# Patient Record
Sex: Male | Born: 1955 | Race: Black or African American | Hispanic: No | Marital: Single | Smoking: Current every day smoker
Health system: Southern US, Community
[De-identification: ages and names within clinical notes are randomized; demographics above are authoritative.]

## PROBLEM LIST (undated history)

## (undated) DIAGNOSIS — R569 Unspecified convulsions: Secondary | ICD-10-CM

## (undated) DIAGNOSIS — F32A Depression, unspecified: Secondary | ICD-10-CM

## (undated) DIAGNOSIS — F329 Major depressive disorder, single episode, unspecified: Secondary | ICD-10-CM

---

## 1998-11-25 ENCOUNTER — Other Ambulatory Visit: Admission: RE | Admit: 1998-11-25 | Discharge: 1998-11-25 | Payer: Self-pay | Admitting: Orthopedic Surgery

## 2000-06-09 ENCOUNTER — Emergency Department (HOSPITAL_COMMUNITY): Admission: EM | Admit: 2000-06-09 | Discharge: 2000-06-09 | Payer: Self-pay

## 2000-06-09 ENCOUNTER — Encounter: Payer: Self-pay | Admitting: Emergency Medicine

## 2002-08-21 ENCOUNTER — Emergency Department (HOSPITAL_COMMUNITY): Admission: EM | Admit: 2002-08-21 | Discharge: 2002-08-21 | Payer: Self-pay | Admitting: Emergency Medicine

## 2006-02-06 ENCOUNTER — Emergency Department (HOSPITAL_COMMUNITY): Admission: EM | Admit: 2006-02-06 | Discharge: 2006-02-06 | Payer: Self-pay | Admitting: Emergency Medicine

## 2014-12-30 ENCOUNTER — Encounter (HOSPITAL_COMMUNITY): Payer: Self-pay | Admitting: Family Medicine

## 2014-12-30 ENCOUNTER — Emergency Department (HOSPITAL_COMMUNITY)
Admission: EM | Admit: 2014-12-30 | Discharge: 2014-12-31 | Disposition: A | Discharge status: Other Acute Inpatient Hospital | Payer: Non-veteran care | Attending: Emergency Medicine | Admitting: Emergency Medicine

## 2014-12-30 DIAGNOSIS — F32A Depression, unspecified: Secondary | ICD-10-CM

## 2014-12-30 DIAGNOSIS — F1010 Alcohol abuse, uncomplicated: Secondary | ICD-10-CM

## 2014-12-30 DIAGNOSIS — G40909 Epilepsy, unspecified, not intractable, without status epilepticus: Secondary | ICD-10-CM | POA: Insufficient documentation

## 2014-12-30 DIAGNOSIS — R45851 Suicidal ideations: Secondary | ICD-10-CM

## 2014-12-30 DIAGNOSIS — Z79899 Other long term (current) drug therapy: Secondary | ICD-10-CM | POA: Insufficient documentation

## 2014-12-30 DIAGNOSIS — Z720 Tobacco use: Secondary | ICD-10-CM | POA: Insufficient documentation

## 2014-12-30 DIAGNOSIS — F329 Major depressive disorder, single episode, unspecified: Secondary | ICD-10-CM | POA: Diagnosis not present

## 2014-12-30 DIAGNOSIS — F1110 Opioid abuse, uncomplicated: Secondary | ICD-10-CM | POA: Insufficient documentation

## 2014-12-30 DIAGNOSIS — Z87898 Personal history of other specified conditions: Secondary | ICD-10-CM

## 2014-12-30 HISTORY — DX: Depression, unspecified: F32.A

## 2014-12-30 HISTORY — DX: Unspecified convulsions: R56.9

## 2014-12-30 HISTORY — DX: Major depressive disorder, single episode, unspecified: F32.9

## 2014-12-30 LAB — COMPREHENSIVE METABOLIC PANEL
ALK PHOS: 63 U/L (ref 38–126)
ALT: 16 U/L — ABNORMAL LOW (ref 17–63)
AST: 17 U/L (ref 15–41)
Albumin: 2.9 g/dL — ABNORMAL LOW (ref 3.5–5.0)
Anion gap: 7 (ref 5–15)
BUN: 14 mg/dL (ref 6–20)
CALCIUM: 8.9 mg/dL (ref 8.9–10.3)
CO2: 27 mmol/L (ref 22–32)
CREATININE: 0.76 mg/dL (ref 0.61–1.24)
Chloride: 101 mmol/L (ref 101–111)
Glucose, Bld: 101 mg/dL — ABNORMAL HIGH (ref 65–99)
Potassium: 4 mmol/L (ref 3.5–5.1)
Sodium: 135 mmol/L (ref 135–145)
Total Bilirubin: 0.5 mg/dL (ref 0.3–1.2)
Total Protein: 6.2 g/dL — ABNORMAL LOW (ref 6.5–8.1)

## 2014-12-30 LAB — RAPID URINE DRUG SCREEN, HOSP PERFORMED
Amphetamines: NOT DETECTED
BENZODIAZEPINES: NOT DETECTED
Barbiturates: NOT DETECTED
Cocaine: NOT DETECTED
OPIATES: NOT DETECTED
TETRAHYDROCANNABINOL: NOT DETECTED

## 2014-12-30 LAB — CBC
HEMATOCRIT: 42.6 % (ref 39.0–52.0)
Hemoglobin: 14.5 g/dL (ref 13.0–17.0)
MCH: 28.6 pg (ref 26.0–34.0)
MCHC: 34 g/dL (ref 30.0–36.0)
MCV: 84 fL (ref 78.0–100.0)
Platelets: 348 10*3/uL (ref 150–400)
RBC: 5.07 MIL/uL (ref 4.22–5.81)
RDW: 14.3 % (ref 11.5–15.5)
WBC: 8.3 10*3/uL (ref 4.0–10.5)

## 2014-12-30 LAB — ACETAMINOPHEN LEVEL: Acetaminophen (Tylenol), Serum: <10 ug/mL — ABNORMAL LOW (ref 10–30)

## 2014-12-30 LAB — SALICYLATE LEVEL: Salicylate Lvl: <4 mg/dL (ref 2.8–30.0)

## 2014-12-30 LAB — ETHANOL

## 2014-12-30 MED ORDER — LORAZEPAM 1 MG PO TABS: 1.0000 mg | ORAL_TABLET | Freq: Three times a day (TID) | ORAL | Status: DC | PRN

## 2014-12-30 MED ORDER — ACETAMINOPHEN 325 MG PO TABS: 650.0000 mg | ORAL_TABLET | ORAL | Status: DC | PRN

## 2014-12-30 MED ORDER — ONDANSETRON HCL 4 MG PO TABS: 4.0000 mg | ORAL_TABLET | Freq: Three times a day (TID) | ORAL | Status: DC | PRN

## 2014-12-30 MED ORDER — ALUM & MAG HYDROXIDE-SIMETH 200-200-20 MG/5ML PO SUSP: 30.0000 mL | ORAL | Status: DC | PRN

## 2014-12-30 MED ORDER — NICOTINE 21 MG/24HR TD PT24
21.0000 mg | MEDICATED_PATCH | Freq: Every day | TRANSDERMAL | Status: DC
  Administered 2014-12-30: 21 mg via TRANSDERMAL
  Filled 2014-12-30: qty 1

## 2014-12-30 MED ORDER — IBUPROFEN 400 MG PO TABS: 600.0000 mg | ORAL_TABLET | Freq: Three times a day (TID) | ORAL | Status: DC | PRN

## 2014-12-30 MED ORDER — ZOLPIDEM TARTRATE 5 MG PO TABS: 5.0000 mg | ORAL_TABLET | Freq: Every evening | ORAL | Status: DC | PRN

## 2014-12-30 NOTE — B[facility] Counselor (Signed)
[facility] CoyerEric Kaplan, pt has been accepted to bed 406-1 pending review of his UDS. Pt's RN aware that UDS needs to be collected.  Evette Cristalaroline Paige Julene Rahn, [place] Therapeutic Triage Specialist

## 2014-12-30 NOTE — ED Provider Notes (Signed)
CSN: [id number]     Arrival date & time 12/30/14  1254 History   First MD Initiated Contact with Patient 12/30/14 1549     Chief Complaint  Patient presents with  . Suicidal     (Consider location/radiation/quality/duration/timing/severity/associated sxs/prior Treatment) HPI Pt is a 59yo male with hx of depression and seizures well controlled with medications, brought to ED by his wife with request for help with his depression and increase SI thoughts. Pt states he has had SI for several months but worse over the last few days. Pt is requesting medication increase or change to help with his severe depression. Pt denies drinking daily but pt's wife indicates pt is a heavy drinker.  Denies seizures from withdrawal from alcohol.  Pt states he has tried to cut himself, his wife took his knife away.  Denies hallucinations. Denies self harm.  Denies HI.  States he has had "several ideas" for SI but did not want to discuss in detail.  Denies any other concerns or complaints at this time. Denies CP, SOB, abdominal pain, n/v/d.   Past Medical History  Diagnosis Date  . Depression   . Seizures    History reviewed. No pertinent past surgical history. History reviewed. No pertinent family history. History  Substance Use Topics  . Smoking status: Current Every Day Smoker  . Smokeless tobacco: Not on file  . Alcohol Use: Yes    Review of Systems  Constitutional: Negative for fever, chills and fatigue.  Respiratory: Negative for shortness of breath.   Cardiovascular: Negative for chest pain and palpitations.  Gastrointestinal: Negative for nausea, vomiting and abdominal pain.  Psychiatric/Behavioral: Positive for suicidal ideas and sleep disturbance. Negative for hallucinations and self-injury. The patient is not nervous/anxious.   All other systems reviewed and are negative.     Allergies  Review of patient's allergies indicates no known allergies.  Home Medications   Prior to Admission  medications   Medication Sig Start Date End Date Taking? Authorizing Provider  lamoTRIgine (LAMICTAL) 100 MG tablet Take 100 mg by mouth 2 (two) times daily.   Yes Historical Provider, MD  levETIRAcetam (KEPPRA) 500 MG tablet Take 2,000 mg by mouth 2 (two) times daily.   Yes Historical Provider, MD  PARoxetine (PAXIL) 40 MG tablet Take 40 mg by mouth every morning.   Yes Historical Provider, MD   BP 172/88 mmHg  Pulse 63  Temp(Src) 98 F (36.7 C)  Resp 18  Ht 5\' 7"  (1.702 m)  Wt 157 lb (71.215 kg)  BMI 24.58 kg/m2  SpO2 98% Physical Exam  Constitutional: He appears well-developed and well-nourished.  HENT:  Head: Normocephalic and atraumatic.  Eyes: Conjunctivae are normal. No scleral icterus.  Neck: Normal range of motion.  Cardiovascular: Normal rate, regular rhythm and normal heart sounds.   Pulmonary/Chest: Effort normal and breath sounds normal. No respiratory distress. He has no wheezes. He has no rales. He exhibits no tenderness.  Abdominal: Soft. Bowel sounds are normal. He exhibits no distension and no mass. There is no tenderness. There is no rebound and no guarding.  Musculoskeletal: Normal range of motion.  Neurological: He is alert.  Skin: Skin is warm and dry.  Psychiatric: His speech is normal and behavior is normal. He exhibits a depressed mood. He expresses suicidal ideation. He expresses no homicidal ideation. He expresses suicidal plans ( "ive had a few"). He expresses no homicidal plans.  Nursing note and vitals reviewed.   ED Course  Procedures (including critical care time)  Labs Review Labs Reviewed  COMPREHENSIVE METABOLIC PANEL - Abnormal; Notable for the following:    Glucose, Bld 101 (*)    Total Protein 6.2 (*)    Albumin 2.9 (*)    ALT 16 (*)    All other components within normal limits  ACETAMINOPHEN LEVEL - Abnormal; Notable for the following:    Acetaminophen (Tylenol), Serum <10 (*)    All other components within normal limits  CBC  ETHANOL   SALICYLATE LEVEL  URINE RAPID DRUG SCREEN (HOSP PERFORMED) NOT AT [facility]    Imaging Review No results found.   EKG Interpretation None      MDM   Final diagnoses:  Suicidal ideations  Alcohol abuse  Depression  History of seizures    Pt is a 59yo male presenting to ED with request for help with increased SI.  Pt states he has thought of cutting himself and "several other ideas"  Denies self harm. Denies HI. Pt has hx of seizures, well controlled with medication, Keppra. Pt medically cleared.   Psych hold orders placed.  Per consult note from [facility], recommended pt be placed for inpatient treatment.     Junius Finner, PA-C 12/30/14 1832  Rolan Bucco, MD 12/30/14 [phone]

## 2014-12-30 NOTE — ED Notes (Signed)
Pt sts that he has been treated for depression over the past 30 years. sts they recently increased medication dose and not feeling better. sts SI thoughts.

## 2014-12-30 NOTE — BH Assessment (Signed)
B[facility] Assessment Progress Note   Patient's UDS is clear.  Tori, AC said pt was fine to come on over.  This clinician called to C pod and spoke to nurse Tiffany and let her know patient was accepted.  Accepting physician is Dr. Jama Flavorsobos.  Tiffany said she would [facility] know about the [facility].  Pt can come over when ready to be sent.

## 2014-12-30 NOTE — ED Notes (Signed)
[facility] contacted for transport 

## 2014-12-30 NOTE — BH Assessment (Addendum)
Tele Assessment Note   Jonathan HoltsDavid Pierce is an 59 y.o. male. Writer was unable to get in contact w/ Jonathan RanaErin OMalley PA-C prior to teleassessment as [facility] secretary couldn't find her. Pt is oriented x 4 and cooperative during assessment. His wife is at bedside. He reports a depressed and anxious mood. His affect is depressed. Pt sts that he has had more frequent SI in the past 8 or 9 mos. Pt sts for the past few mos he has experienced SI "2 to 3" times daily. He says, "I'm starting to act on them." Pt reports he has been walking around the house with a knife lately. He says that his wife takes the knife away from him. Pt sts that he thinks "how soon I could do this and be done with it" referring to suicide. Pt reports he thinks of cutting himself or driving off a bridge or into traffic as a suicide attempt. He says that is afraid that he will harm or kill himself. Pt says he was in the [employer] for 6 yrs and [facility] management and therapy. He denies hx of suicide attempts and denies hx of inpatient admissions. Pt reports he drinks approx one six pack of 12 oz beers once a week. Pt reports he first noticed depressive sxs in 1986. Pt denies there was any precipitating factor to onset of depression. Pt reports sister has been dx with schizophrenia and he asks Clinical research associatewriter, "Is this as bad as what she has?". Writer reassured pt that pt's sxs weren't indicative of schizophrenia. He denies [facility] and no delusions noted. Wife reports pt has become confused over the past year and that he has become irritable with mood swings.  Writer ran pt by Claudette Headonrad Withrow NP who recommends inpatient treatment. Writer notified pt's RN Jonathan Pierce of [facility]position and writer asked that pt's UDS be processed.  Axis I:  Major Depressive Disorder, Recurrent, Severe without Psychotic Features Axis II: Deferred Axis III:  Past Medical History  Diagnosis Date  . Depression   . Seizures    Axis IV: other psychosocial or  environmental problems, problems related to social environment and problems with primary support group Axis V: 31-40 impairment in reality testing  Past Medical History:  Past Medical History  Diagnosis Date  . Depression   . Seizures     History reviewed. No pertinent past surgical history.  Family History: History reviewed. No pertinent family history.  Social History:  reports that he has been smoking.  He does not have any smokeless tobacco history on file. He reports that he drinks alcohol. He reports that he does not use illicit drugs.  Additional Social History:  Alcohol / Drug Use Pain Medications: pt denies abuse - see PTA meds list Prescriptions: pt denies abuse - see PTA meds list Over the Counter: pt denies abuse - see PTA meds list History of alcohol / drug use?: Yes Substance #1 Name of Substance 1: alcohol 1 - Age of First Use: 23 1 - Amount (size/oz): 6 pack 12 oz beers 1 - Last Use / Amount: 12/28/14  CIWA: CIWA-Ar BP: 172/88 mmHg Pulse Rate: 63 COWS:    PATIENT STRENGTHS: (choose at least two) Ability for insight Average or above average intelligence Communication skills General fund of knowledge  Allergies: No Known Allergies  Home Medications:  (Not in a hospital admission)  OB/GYN Status:  No LMP for male patient.  General Assessment Data [facility]  TTS Assessment: In system Is this a Tele or Face-to-Face Assessment?: Tele Assessment Is this an Initial Assessment or a Re-assessment for this encounter?: Initial Assessment Marital status: Married [place] name: na Is patient pregnant?: No Living Arrangements: Spouse/significant other Can pt return to current living arrangement?: Yes Admission Status: Voluntary Is patient capable of signing voluntary admission?: Yes Referral Source: Self/Family/Friend Insurance type: SP (EPIC sts self pay but pt goes to [facility] [place])     Crisis Care Plan Living Arrangements:  Spouse/significant other Name of Psychiatrist: [name], [facility] [place] Name of Therapist: none  Education Status Is patient currently in school?: No Highest grade of school patient has completed: 12  Risk to self with the past 6 months Suicidal Ideation: Yes-Currently Present Has patient been a risk to self within the past 6 months prior to admission? : Yes Suicidal Intent: Yes-Currently Present Has patient had any suicidal intent within the past 6 months prior to admission? : Yes Is patient at risk for suicide?: Yes Suicidal Plan?: Yes-Currently Present Has patient had any suicidal plan within the past 6 months prior to admission? : Yes Specify Current Suicidal Plan: cut himself w knife, drive off bridge or into traffic Access to Means: Yes Specify Access to Suicidal Means: access to sharps, access to car What has been your use of drugs/alcohol within the last 12 months?: pt sts drinks alcohol once weekly Previous Attempts/Gestures: No How many times?: 0 Other Self Harm Risks: none Triggers for Past Attempts:  (n/a) Intentional Self Injurious Behavior: None Family Suicide History: No (pt's sister dx with schizophrenia) Recent stressful life event(s): Other (Comment), Recent negative physical changes (increasing depressive sxs) Persecutory voices/beliefs?: No Depression: Yes Depression Symptoms: Despondent, Insomnia, Isolating, Fatigue, Guilt, Loss of interest in usual pleasures, Feeling angry/irritable, Feeling worthless/self pity Substance abuse history and/or treatment for substance abuse?: No Suicide prevention information given to non-admitted patients: Not applicable  Risk to Others within the past 6 months Homicidal Ideation: No Does patient have any lifetime risk of violence toward others beyond the six months prior to admission? : No Thoughts of Harm to Others: No Current Homicidal Intent: No Current Homicidal Plan: No Access to Homicidal Means: No Identified  Victim: none History of harm to others?: No Assessment of Violence: None Noted Violent Behavior Description: pt denies hx violence - is calm and cooperative Does patient have access to weapons?: No Criminal Charges Pending?: No Does patient have a court date: No Is patient on probation?: No  Psychosis Hallucinations: None noted Delusions: None noted  Mental Status Report Appearance/Hygiene: In scrubs (unremarkable) Eye Contact: Good Motor Activity: Freedom of movement Speech: Logical/coherent Level of Consciousness: Alert Mood: Depressed, Anxious, Sad, Anhedonia Affect: Appropriate to circumstance, Depressed, Sad Anxiety Level: Moderate Thought Processes: Relevant, Coherent Judgement: Unimpaired Orientation: Person, Place, Time, Situation Obsessive Compulsive Thoughts/Behaviors: None  Cognitive Functioning Concentration: Normal Memory: Recent Impaired, Remote Intact IQ: Average Insight: Good Impulse Control: Fair Appetite: Poor Weight Loss:  (pt sts thinks he has lost weight but unknown amount) Sleep: Decreased Total Hours of Sleep: 4 Vegetative Symptoms: None  ADLScreening [facility] Assessment Services) Patient's cognitive ability adequate to safely complete daily activities?: Yes Patient able to express need for assistance with ADLs?: Yes Independently performs ADLs?: Yes (appropriate for developmental age)  Prior Inpatient Therapy Prior Inpatient Therapy: No Prior Therapy Dates: na Prior Therapy Facilty/Provider(s): na Reason for Treatment: na  Prior Outpatient Therapy Prior Outpatient Therapy: Yes Prior Therapy Dates: currently once a month Prior Therapy Facilty/Provider(s): [place] -  Jonathan Ranks NP Reason for Treatment: MDD, med management Does patient have an ACCT team?: No Does patient have Intensive In-House Services?  : No Does patient have [facility] services? : No Does patient have P4CC services?: No  ADL Screening (condition at time of  admission) Patient's cognitive ability adequate to safely complete daily activities?: Yes Is the patient deaf or have difficulty hearing?: No Does the patient have difficulty seeing, even when wearing glasses/contacts?: No Does the patient have difficulty concentrating, remembering, or making decisions?: Yes Patient able to express need for assistance with ADLs?: Yes Does the patient have difficulty dressing or bathing?: No Independently performs ADLs?: Yes (appropriate for developmental age) Does the patient have difficulty walking or climbing stairs?: No Weakness of Legs: None Weakness of Arms/Hands: None  Home Assistive Devices/Equipment Home Assistive Devices/Equipment: Eyeglasses    Abuse/Neglect Assessment (Assessment to be complete while patient is alone) Physical Abuse: Denies Verbal Abuse: Denies Sexual Abuse: Denies Exploitation of patient/patient's resources: Denies Self-Neglect: Denies     Merchant navy officer (For Healthcare) Does patient have an advance directive?: No Would patient like information on creating an advanced directive?: No - patient declined information    Additional Information 1:1 In Past 12 Months?: No CIRT Risk: No Elopement Risk: No Does patient have medical clearance?: Yes     Disposition:  Disposition Initial Assessment Completed for this Encounter: Yes Disposition of Patient: Inpatient treatment program Type of inpatient treatment program: Adult (conrad withrow NP recommends inpatient treatment)  Pressley Tadesse P 12/30/2014 4:49 PM

## 2014-12-31 ENCOUNTER — Inpatient Hospital Stay (HOSPITAL_COMMUNITY)
Admission: AD | Admit: 2014-12-31 | Discharge: 2015-01-02 | DRG: 885 | Disposition: A | Discharge status: Home / Self Care | Payer: Federal, State, Local not specified - Other | Source: Intra-hospital | Attending: Psychiatry | Admitting: Psychiatry

## 2014-12-31 ENCOUNTER — Encounter (HOSPITAL_COMMUNITY): Payer: Self-pay | Admitting: *Deleted

## 2014-12-31 DIAGNOSIS — Z818 Family history of other mental and behavioral disorders: Secondary | ICD-10-CM

## 2014-12-31 DIAGNOSIS — F332 Major depressive disorder, recurrent severe without psychotic features: Secondary | ICD-10-CM | POA: Diagnosis present

## 2014-12-31 DIAGNOSIS — G4700 Insomnia, unspecified: Secondary | ICD-10-CM | POA: Diagnosis present

## 2014-12-31 DIAGNOSIS — R45851 Suicidal ideations: Secondary | ICD-10-CM | POA: Diagnosis present

## 2014-12-31 DIAGNOSIS — F17210 Nicotine dependence, cigarettes, uncomplicated: Secondary | ICD-10-CM | POA: Diagnosis present

## 2014-12-31 MED ORDER — ALUM & MAG HYDROXIDE-SIMETH 200-200-20 MG/5ML PO SUSP: 30.0000 mL | ORAL | Status: DC | PRN

## 2014-12-31 MED ORDER — MAGNESIUM HYDROXIDE 400 MG/5ML PO SUSP
30.0000 mL | Freq: Every day | ORAL | Status: DC | PRN
Start: 2014-12-31 — End: 2015-01-02

## 2014-12-31 MED ORDER — HYDROXYZINE HCL 25 MG PO TABS
25.0000 mg | ORAL_TABLET | Freq: Four times a day (QID) | ORAL | Status: DC | PRN
  Administered 2014-12-31: 25 mg via ORAL
  Filled 2014-12-31: qty 20

## 2014-12-31 MED ORDER — CITALOPRAM HYDROBROMIDE 10 MG PO TABS
10.0000 mg | ORAL_TABLET | Freq: Every day | ORAL | Status: DC
  Administered 2015-01-01: 10 mg via ORAL
  Filled 2014-12-31 (×3): qty 1

## 2014-12-31 MED ORDER — LEVETIRACETAM 750 MG PO TABS
2000.0000 mg | ORAL_TABLET | Freq: Two times a day (BID) | ORAL | Status: DC
  Administered 2014-12-31 – 2015-01-02 (×6): 2000 mg via ORAL
  Filled 2014-12-31 (×2): qty 1
  Filled 2014-12-31: qty 6
  Filled 2014-12-31 (×7): qty 1
  Filled 2014-12-31: qty 4
  Filled 2014-12-31: qty 1

## 2014-12-31 MED ORDER — TRAZODONE HCL 50 MG PO TABS
50.0000 mg | ORAL_TABLET | Freq: Every evening | ORAL | Status: DC | PRN
  Administered 2014-12-31 – 2015-01-01 (×2): 50 mg via ORAL
  Filled 2014-12-31: qty 1
  Filled 2014-12-31: qty 28
  Filled 2014-12-31 (×2): qty 1
  Filled 2014-12-31: qty 28
  Filled 2014-12-31 (×5): qty 1

## 2014-12-31 MED ORDER — PAROXETINE HCL 20 MG PO TABS
40.0000 mg | ORAL_TABLET | ORAL | Status: DC
  Administered 2014-12-31: 40 mg via ORAL
  Filled 2014-12-31 (×4): qty 2

## 2014-12-31 MED ORDER — ACETAMINOPHEN 325 MG PO TABS: 650.0000 mg | ORAL_TABLET | Freq: Four times a day (QID) | ORAL | Status: DC | PRN

## 2014-12-31 NOTE — BHH Suicide Risk Assessment (Signed)
[facility] Admission Suicide Risk Assessment   Nursing information obtained from:    Demographic factors:   59 yo married man, employed, has adult children Current Mental Status:   see below Loss Factors:   worsening depression, financial difficulties Historical Factors:   long history of depression Risk Reduction Factors:   resilience Total Time spent with patient: 45 minutes Principal Problem: MDD (major depressive disorder), recurrent episode, severe Diagnosis:   Patient Active Problem List   Diagnosis Date Noted  . MDD (major depressive disorder), recurrent episode, severe [F33.2] 12/31/2014     Continued Clinical Symptoms:  Alcohol Use Disorder Identification Test Final Score (AUDIT): 6 The "Alcohol Use Disorders Identification Test", Guidelines for Use in Primary Care, Second Edition.  World Science writer [facility]). Score between 0-7:  no or low risk or alcohol related problems. Score between 8-15:  moderate risk of alcohol related problems. Score between 16-19:  high risk of alcohol related problems. Score 20 or above:  warrants further diagnostic evaluation for alcohol dependence and treatment.   CLINICAL FACTORS:   Patient is a 59 yo man. He is married, has 2 adult children. He states he has a long history of depression. He states that for many years he's been treated with Paxil which he has historically tolerated well and responded to. However, over recent weeks to months his depression had worsened. He attributes this partially to having lost job (now is employed again) and financial difficulties. He denies alcohol or drug abuse. Of note, has a history of epilepsy x many years. Well controlled on Keppra. Due to worsening depression, Paxil dose had been increased without any improvement. He has developed suicidal ideations and yesterday was thinking about cutting himself with a knife which led to admission. Diagnosis: Major Depression, Recurrent, without psychotic features.  Seizure disorder. Plan: Agrees to D/C Paxil. Start Celexa 10 mg qday initially. Continue Keppra.   Musculoskeletal: Strength & Muscle Tone: within normal limits Gait & Station: normal Patient leans: N/A  Psychiatric Specialty Exam: Physical Exam  Review of Systems  Constitutional: Negative.   HENT: Negative.   Eyes: Negative.   Respiratory: Negative.   Cardiovascular: Negative.   Gastrointestinal: Negative.   Genitourinary: Negative.   Musculoskeletal: Negative.   Skin: Negative.   Neurological: Positive for seizures.  Endo/Heme/Allergies: Negative.   Psychiatric/Behavioral: Positive for depression and suicidal ideas.  All other systems negative.   Blood pressure 108/60, pulse 99, temperature 98.7 F (37.1 C), temperature source Oral, resp. rate 17, height  (1.702 m), weight 157 lb (71.215 kg).Body mass index is 24.58 kg/(m^2).  General Appearance: Fairly Groomed  Patent attorney::  Good  Speech:  Slow  Volume:  Decreased  Mood:  Depressed  Affect:  Constricted  Thought Process:  Linear  Orientation:  Full (Time, Place, and Person)  Thought Content:  No hallucinations, no delusions  Suicidal Thoughts:  Yes.  without intent/plan at this time, denies any plan or intention of hurting self and contracts for safety on unit  Homicidal Thoughts:  No  Memory:  Recent and remote grossly intact  Judgement:  Fair  Insight:  present  Psychomotor Activity:  Decreased  Concentration:  Good  Recall:  Good  Fund of Knowledge:Good  Language: Good  Akathisia:  Negative  Handed:  Right  AIMS (if indicated):     Assets:  Communication Skills Desire for Improvement Housing  Sleep:     Cognition: WNL  ADL's:  Impaired     COGNITIVE FEATURES THAT CONTRIBUTE TO RISK:  Loss of executive function    SUICIDE RISK:   Moderate:  Frequent suicidal ideation with limited intensity, and duration, some specificity in terms of plans, no associated intent, good self-control, limited  dysphoria/symptomatology, some risk factors present, and identifiable protective factors, including available and accessible social support.  PLAN OF CARE: Patient will be admitted to inpatient psychiatric unit for stabilization and safety. Will provide and encourage milieu participation. Provide medication management and maked adjustments as needed.  Will follow daily.    Medical Decision Making:  Review of Psycho-Social Stressors (1), Review or order clinical lab tests (1), Established Problem, Worsening (2) and Review of Medication Regimen & Side Effects (2)  I certify that inpatient services furnished can reasonably be expected to improve the patient's condition.   Leiloni Smithers 12/31/2014, 8:00 PM

## 2014-12-31 NOTE — BHH Group Notes (Signed)
B[facility] Group Notes:  recovery  Date:  12/31/2014  Time:  5:04 PM  Type of Therapy:  Nurse Education  Participation Level:  Did Not Attend  Participation Quality:  Inattentive  Affect:  Blunted  Cognitive:  Lacking  Insight:  None  Engagement in Group:  None  Modes of Intervention:  Discussion  Summary of Progress/Problems:  Jonathan Pierce 12/31/2014, 5:04 PM 

## 2014-12-31 NOTE — H&P (Signed)
Psychiatric Admission Assessment Adult  Patient Identification: Jonathan Pierce MRN:  [id number] Date of Evaluation:  12/31/2014 Chief Complaint:  MDD Recurrent Severe Principal Diagnosis: MDD (major depressive disorder), recurrent episode, severe Diagnosis:   Patient Active Problem List   Diagnosis Date Noted  . MDD (major depressive disorder), recurrent episode, severe [F33.2] 12/31/2014   History of Present Illness: Jonathan Pierce, 59 yr old, veteran, married (30 yrs), good relationship with children was seen in the ED for suicidal ideation.  He states he had a knife in his hand, his wife came home and he asked her to bring him to the hospital.  He was being seen by a "outpatient practitioner at the [place], he had been taking Paroxetine for 30 years.  They asked me to take 1 whole pill, but I always take 1/2.  He reports that his depressed mood had worsened steadily in the last 7-8 months.  He had a job (4 years) that he was miserable in and he lost that job this past May.  He is at a new job currently and is anxious about being let go because he was admitted here.  He states he notified them.    He denies diabetes and hypertension.  He does have a seizure disorder that he takes Keppra for.  He denies ETOH, cocaine and cannabis.  He denies experiencing bipolar mood symptoms and psychotic symptoms.  He does stats that he had a sister who was on meds for schizophrenia.  He verbalized various times in the last few years wherein he wants to walk into traffic, jump off a bridge and go to sleep and never to wake up.  He does still enjoy listening to music which is a favorite past time.    Per tele note:  Kern Gingras is an 59 y.o. male. Writer was unable to get in contact w/ Greggory Stallion PA-C prior to teleassessment as Educational psychologist couldn't find her.  Pt is oriented x 4 and cooperative during assessment. His wife is at bedside. He reports a depressed and anxious mood. His affect is depressed. Pt sts that he has  had more frequent SI in the past 8 or 9 mos. Pt sts for the past few mos he has experienced SI "2 to 3" times daily. He says, "I'm starting to act on them." Pt reports he has been walking around the house with a knife lately. He says that his wife takes the knife away from him. Pt sts that he thinks "how soon I could do this and be done with it" referring to suicide. Pt reports he thinks of cutting himself or driving off a bridge or into traffic as a suicide attempt. He says that is afraid that he will harm or kill himself. Pt says he was in the [employer] for 6 yrs and he sees [name] NP at [facility] for [employer] and therapy. He denies hx of suicide attempts and denies hx of inpatient admissions. Pt reports he drinks approx one six pack of 12 oz beers once a week. Pt reports he first noticed depressive sxs in 1986. Pt denies there was any precipitating factor to onset of depression. Pt reports sister has been dx with schizophrenia and he asks Probation officer, "Is this as bad as what she has?". Writer reassured pt that pt's sxs weren't indicative of schizophrenia. He denies [facility] and no delusions noted. Wife reports pt has become confused over the past year and that he has become irritable with mood swings.  Writer ran pt by Catalina Pizza NP who recommends inpatient treatment. Writer notified pt's RN Janett Billow of [employer] asked that pt's UDS be processed.  Elements:  Location:  see HPI. Timing:  see HPI. Duration:  see HPI. Context:  see HPI. Associated Signs/Symptoms: Depression Symptoms:  depressed mood, insomnia, fatigue, difficulty concentrating, recurrent thoughts of death, disturbed sleep, (Hypo) Manic Symptoms:  NA Anxiety Symptoms:  NA Psychotic Symptoms:  NA PTSD Symptoms: NA Total Time spent with patient: 30 minutes  Past Medical History:  Past Medical History  Diagnosis Date  . Depression   . Seizures    History reviewed. No pertinent past surgical history. Family  History: History reviewed. No pertinent family history. Social History:  History  Alcohol Use  . Yes    Comment: occasional 6 pack a week     History  Drug Use No    History   Social History  . Marital Status: Single    Spouse Name: N/A  . Number of Children: N/A  . Years of Education: N/A   Social History Main Topics  . Smoking status: Current Every Day Smoker -- 1.00 packs/day    Types: Cigarettes  . Smokeless tobacco: Not on file  . Alcohol Use: Yes     Comment: occasional 6 pack a week  . Drug Use: No  . Sexual Activity: Not on file   Other Topics Concern  . None   Social History Narrative   Additional Social History: See HPI  Musculoskeletal: Strength & Muscle Tone: within normal limits Gait & Station: normal Patient leans: N/A  Psychiatric Specialty Exam: Physical Exam  Vitals reviewed.   Review of Systems  All other systems reviewed and are negative.   Blood pressure 108/60, pulse 99, temperature 98.7 F (37.1 C), temperature source Oral, resp. rate 17, height _0  (1.702 m), weight 71.215 kg (157 lb).Body mass index is 24.58 kg/(m^2).  General Appearance: Casual  Eye Contact::  Good  Speech:  Normal Rate  Volume:  Normal  Mood:  Depressed  Affect:  Appropriate  Thought Process:  Coherent  Orientation:  Full (Time, Place, and Person)  Thought Content:  Rumination  Suicidal Thoughts:  No  Homicidal Thoughts:  No  Memory:  Immediate;   Good Recent;   Good Remote;   Good  Judgement:  Good  Insight:  Good  Psychomotor Activity:  Normal  Concentration:  Good  Recall:  Jonathan Pierce of Knowledge:Good  Language: Good  Akathisia:  Negative  Handed:  Right  AIMS (if indicated):     Assets:  Desire for Improvement  ADL's:  Intact  Cognition: WNL  Sleep:      Risk to Self: Is patient at risk for suicide?: Yes What has been your use of drugs/alcohol within the last 12 months?: Denies Risk to Others:   Prior Inpatient Therapy:   Prior  Outpatient Therapy:    Alcohol Screening: 1. How often do you have a drink containing alcohol?: 2 to 4 times a month 2. How many drinks containing alcohol do you have on a typical day when you are drinking?: 5 or 6 3. How often do you have six or more drinks on one occasion?: Monthly Preliminary Score: 4 4. How often during the last year have you found that you were not able to stop drinking once you had started?: Never 5. How often during the last year have you failed to do what was normally expected from you becasue of drinking?: Never  6. How often during the last year have you needed a first drink in the morning to get yourself going after a heavy drinking session?: Never 7. How often during the last year have you had a feeling of guilt of remorse after drinking?: Never 8. How often during the last year have you been unable to remember what happened the night before because you had been drinking?: Never 9. Have you or someone else been injured as a result of your drinking?: No 10. Has a relative or friend or a doctor or another health worker been concerned about your drinking or suggested you cut down?: No Alcohol Use Disorder Identification Test Final Score (AUDIT): 6 Brief Intervention: AUDIT score less than 7 or less-screening does not suggest unhealthy drinking-brief intervention not indicated  Allergies:  No Known Allergies Lab Results:  Results for orders placed or performed during the hospital encounter of 12/30/14 (from the past 48 hour(s))  CBC     Status: None   Collection Time: 12/30/14  2:30 PM  Result Value Ref Range   WBC 8.3 4.0 - 10.5 K/uL   RBC 5.07 4.22 - 5.81 MIL/uL   Hemoglobin 14.5 13.0 - 17.0 g/dL   HCT 42.6 39.0 - 52.0 %   MCV 84.0 78.0 - 100.0 fL   MCH 28.6 26.0 - 34.0 pg   MCHC 34.0 30.0 - 36.0 g/dL   RDW 14.3 11.5 - 15.5 %   Platelets 348 150 - 400 K/uL  Comprehensive metabolic panel     Status: Abnormal   Collection Time: 12/30/14  2:30 PM  Result Value  Ref Range   Sodium 135 135 - 145 mmol/L   Potassium 4.0 3.5 - 5.1 mmol/L   Chloride 101 101 - 111 mmol/L   CO2 27 22 - 32 mmol/L   Glucose, Bld 101 (H) 65 - 99 mg/dL   BUN 14 6 - 20 mg/dL   Creatinine, Ser 0.76 0.61 - 1.24 mg/dL   Calcium 8.9 8.9 - 10.3 mg/dL   Total Protein 6.2 (L) 6.5 - 8.1 g/dL   Albumin 2.9 (L) 3.5 - 5.0 g/dL   AST 17 15 - 41 U/L   ALT 16 (L) 17 - 63 U/L   Alkaline Phosphatase 63 38 - 126 U/L   Total Bilirubin 0.5 0.3 - 1.2 mg/dL   GFR calc non Af Amer >60 >60 mL/min   GFR calc Af Amer >60 >60 mL/min    Comment: (NOTE) The eGFR has been calculated using the CKD EPI equation. This calculation has not been validated in all clinical situations. eGFR's persistently <60 mL/min signify possible Chronic Kidney Disease.    Anion gap 7 5 - 15  Ethanol (ETOH)     Status: None   Collection Time: 12/30/14  2:30 PM  Result Value Ref Range   Alcohol, Ethyl (B) <5 <5 mg/dL    Comment:        LOWEST DETECTABLE LIMIT FOR SERUM ALCOHOL IS 11 mg/dL FOR MEDICAL PURPOSES ONLY   Acetaminophen level     Status: Abnormal   Collection Time: 12/30/14  2:30 PM  Result Value Ref Range   Acetaminophen (Tylenol), Serum <10 (L) 10 - 30 ug/mL    Comment:        THERAPEUTIC CONCENTRATIONS VARY SIGNIFICANTLY. A RANGE OF 10-30 ug/mL MAY BE AN EFFECTIVE CONCENTRATION FOR MANY PATIENTS. HOWEVER, SOME ARE BEST TREATED AT CONCENTRATIONS OUTSIDE THIS RANGE. ACETAMINOPHEN CONCENTRATIONS >150 ug/mL AT 4 HOURS AFTER INGESTION AND >50 ug/mL AT 12  HOURS AFTER INGESTION ARE OFTEN ASSOCIATED WITH TOXIC REACTIONS.   Salicylate level     Status: None   Collection Time: 12/30/14  2:30 PM  Result Value Ref Range   Salicylate Lvl <0.9 2.8 - 30.0 mg/dL  Urine rapid drug screen (hosp performed)not at [facility]     Status: None   Collection Time: 12/30/14  5:39 PM  Result Value Ref Range   Opiates NONE DETECTED NONE DETECTED   Cocaine NONE DETECTED NONE DETECTED   Benzodiazepines NONE DETECTED  NONE DETECTED   Amphetamines NONE DETECTED NONE DETECTED   Tetrahydrocannabinol NONE DETECTED NONE DETECTED   Barbiturates NONE DETECTED NONE DETECTED    Comment:        DRUG SCREEN FOR MEDICAL PURPOSES ONLY.  IF CONFIRMATION IS NEEDED FOR ANY PURPOSE, NOTIFY LAB WITHIN 5 DAYS.        LOWEST DETECTABLE LIMITS FOR URINE DRUG SCREEN Drug Class       Cutoff (ng/mL) Amphetamine      1000 Barbiturate      200 Benzodiazepine   628 Tricyclics       366 Opiates          300 Cocaine          300 THC              50    Current Medications: Current Facility-Administered Medications  Medication Dose Route Frequency Provider Last Rate Last Dose  . acetaminophen (TYLENOL) tablet 650 mg  650 mg Oral Q6H PRN Laverle Hobby, PA-C      . alum & mag hydroxide-simeth (MAALOX/MYLANTA) 200-200-20 MG/5ML suspension 30 mL  30 mL Oral Q4H PRN Laverle Hobby, PA-C      . hydrOXYzine (ATARAX/VISTARIL) tablet 25 mg  25 mg Oral Q6H PRN Laverle Hobby, PA-C   25 mg at 12/31/14 0245  . levETIRAcetam (KEPPRA) tablet 2,000 mg  2,000 mg Oral BID Laverle Hobby, PA-C   2,000 mg at 12/31/14 0844  . magnesium hydroxide (MILK OF MAGNESIA) suspension 30 mL  30 mL Oral Daily PRN Laverle Hobby, PA-C      . PARoxetine (PAXIL) tablet 40 mg  40 mg Oral BH-q7a Spencer E Simon, PA-C   40 mg at 12/31/14 0844  . traZODone (DESYREL) tablet 50 mg  50 mg Oral QHS,MR X 1 Laverle Hobby, PA-C       PTA Medications: Prescriptions prior to admission  Medication Sig Dispense Refill Last Dose  . lamoTRIgine (LAMICTAL) 100 MG tablet Take 100 mg by mouth 2 (two) times daily.   12/30/2014 at Unknown time  . levETIRAcetam (KEPPRA) 500 MG tablet Take 2,000 mg by mouth 2 (two) times daily.   12/30/2014 at Unknown time  . PARoxetine (PAXIL) 40 MG tablet Take 40 mg by mouth every morning.   12/30/2014 at Unknown time    Previous Psychotropic Medications: Yes   Substance Abuse History in the last 12 months:  No.    Consequences of  Substance Abuse: NA  Results for orders placed or performed during the hospital encounter of 12/30/14 (from the past 72 hour(s))  CBC     Status: None   Collection Time: 12/30/14  2:30 PM  Result Value Ref Range   WBC 8.3 4.0 - 10.5 K/uL   RBC 5.07 4.22 - 5.81 MIL/uL   Hemoglobin 14.5 13.0 - 17.0 g/dL   HCT 42.6 39.0 - 52.0 %   MCV 84.0 78.0 - 100.0 fL   MCH 28.6  26.0 - 34.0 pg   MCHC 34.0 30.0 - 36.0 g/dL   RDW 14.3 11.5 - 15.5 %   Platelets 348 150 - 400 K/uL  Comprehensive metabolic panel     Status: Abnormal   Collection Time: 12/30/14  2:30 PM  Result Value Ref Range   Sodium 135 135 - 145 mmol/L   Potassium 4.0 3.5 - 5.1 mmol/L   Chloride 101 101 - 111 mmol/L   CO2 27 22 - 32 mmol/L   Glucose, Bld 101 (H) 65 - 99 mg/dL   BUN 14 6 - 20 mg/dL   Creatinine, Ser 0.76 0.61 - 1.24 mg/dL   Calcium 8.9 8.9 - 10.3 mg/dL   Total Protein 6.2 (L) 6.5 - 8.1 g/dL   Albumin 2.9 (L) 3.5 - 5.0 g/dL   AST 17 15 - 41 U/L   ALT 16 (L) 17 - 63 U/L   Alkaline Phosphatase 63 38 - 126 U/L   Total Bilirubin 0.5 0.3 - 1.2 mg/dL   GFR calc non Af Amer >60 >60 mL/min   GFR calc Af Amer >60 >60 mL/min    Comment: (NOTE) The eGFR has been calculated using the CKD EPI equation. This calculation has not been validated in all clinical situations. eGFR's persistently <60 mL/min signify possible Chronic Kidney Disease.    Anion gap 7 5 - 15  Ethanol (ETOH)     Status: None   Collection Time: 12/30/14  2:30 PM  Result Value Ref Range   Alcohol, Ethyl (B) <5 <5 mg/dL    Comment:        LOWEST DETECTABLE LIMIT FOR SERUM ALCOHOL IS 11 mg/dL FOR MEDICAL PURPOSES ONLY   Acetaminophen level     Status: Abnormal   Collection Time: 12/30/14  2:30 PM  Result Value Ref Range   Acetaminophen (Tylenol), Serum <10 (L) 10 - 30 ug/mL    Comment:        THERAPEUTIC CONCENTRATIONS VARY SIGNIFICANTLY. A RANGE OF 10-30 ug/mL MAY BE AN EFFECTIVE CONCENTRATION FOR MANY PATIENTS. HOWEVER, SOME ARE BEST  TREATED AT CONCENTRATIONS OUTSIDE THIS RANGE. ACETAMINOPHEN CONCENTRATIONS >150 ug/mL AT 4 HOURS AFTER INGESTION AND >50 ug/mL AT 12 HOURS AFTER INGESTION ARE OFTEN ASSOCIATED WITH TOXIC REACTIONS.   Salicylate level     Status: None   Collection Time: 12/30/14  2:30 PM  Result Value Ref Range   Salicylate Lvl <6.7 2.8 - 30.0 mg/dL  Urine rapid drug screen (hosp performed)not at [facility]     Status: None   Collection Time: 12/30/14  5:39 PM  Result Value Ref Range   Opiates NONE DETECTED NONE DETECTED   Cocaine NONE DETECTED NONE DETECTED   Benzodiazepines NONE DETECTED NONE DETECTED   Amphetamines NONE DETECTED NONE DETECTED   Tetrahydrocannabinol NONE DETECTED NONE DETECTED   Barbiturates NONE DETECTED NONE DETECTED    Comment:        DRUG SCREEN FOR MEDICAL PURPOSES ONLY.  IF CONFIRMATION IS NEEDED FOR ANY PURPOSE, NOTIFY LAB WITHIN 5 DAYS.        LOWEST DETECTABLE LIMITS FOR URINE DRUG SCREEN Drug Class       Cutoff (ng/mL) Amphetamine      1000 Barbiturate      200 Benzodiazepine   124 Tricyclics       580 Opiates          300 Cocaine          300 THC  50     Observation Level/Precautions:  15 minute checks  Laboratory:  per ED  Psychotherapy:  group  Medications:  As per med list  Consultations:  As needed  Discharge Concerns:  safety  Estimated LOS: 2-5 days  Other:     Psychological Evaluations: Yes   Treatment Plan Summary: Admit for crisis management and mood stabilization. Medication management to re-stabilize current mood symptoms Group counseling sessions for coping skills Medical consults as needed Review and reinstate any pertinent home medications for other health problems   Medical Decision Making:  Self-Limited or Minor (1), New problem, with additional work up planned, Review of Psycho-Social Stressors (1), Discuss test with performing physician (1), Decision to obtain old records (1), Review of Medication Regimen & Side Effects  (2) and Review of New Medication or Change in Dosage (2)  I certify that inpatient services furnished can reasonably be expected to improve the patient's condition.   Freda Munro May Agustin AGNP-BC 6/7/20165:50 PM   Patient case discussed with NP and patient seen by me Agree with NP Note and Assessment Patient is a 59 yo man. He is married, has 2 adult children. He states he has a long history of depression. He states that for many years he's been treated with Paxil which he has historically tolerated well and responded to. However, over recent weeks to months his depression had worsened. He attributes this partially to having lost job (now is employed again) and financial difficulties. He denies alcohol or drug abuse. Of note, has a history of epilepsy x many years. Well controlled on Keppra. Due to worsening depression, Paxil dose had been increased without any improvement. He has developed suicidal ideations and yesterday was thinking about cutting himself with a knife which led to admission. Diagnosis: Major Depression, Recurrent, without psychotic features. Seizure disorder. Plan: Agrees to D/C Paxil. Start Celexa 10 mg qday initially. Continue Keppra.

## 2014-12-31 NOTE — Progress Notes (Signed)
Recreation Therapy Notes  Animal-Assisted Activity (AAA) Program Checklist/Progress Notes Patient Eligibility Criteria Checklist & Daily Group note for Rec Tx Intervention  Date: 06.07.16 Time: 2:30pm Location: [address] Dayroom   AAA/T Program Assumption of Risk Form signed by Patient/ or Parent Legal Guardian yes  Patient is free of allergies or sever asthma yes  Patient reports no fear of animals yes  Patient reports no history of cruelty to animals yes  Patient understands his/her participation is voluntary yes  Patient washes hands before animal contact yes  Patient washes hands after animal contact yes  Education: Hand Washing, Appropriate Animal Interaction   Education Outcome: Acknowledges understanding/In group clarification offered/Needs additional education.   Clinical Observations/Feedback: Patient did not attend group.   Burnie Therien, LRT/CTRS         Annie Roseboom A 12/31/2014 4:10 PM 

## 2014-12-31 NOTE — Progress Notes (Signed)
D: Pt presents with flat affect and depressed mood. Pt denies feeling suicidal this morning. Pt denies feeling depressed but appears sad. Pt appears to be minimizing his symptoms. Pt cautious and forwarded little information during shift assessment. Pt compliant with taking meds and attending groups. No adverse reaction to meds verbalized by pt. Pt appears disheveled and is in scrubs.  A: Medications administered as ordered per MD. Verbal support given. Pt encouraged to attend groups. 15 minute checks performed for safety.  R: Pt receptive to treatment.

## 2014-12-31 NOTE — Progress Notes (Signed)
Writer introduced self to pt. Pt reports that this his Paxil has "not been working well".  Pt reports his Keppra as effective. Pt is unable to recall his last seizure. Pt was informed of the unit's morning regimen. Writer offered to bring pt a breakfast tray to his room due to his late arrival. Pt was provided with foods and fluids upon arriving to the unit. Pt had no additional concerns for this writer to address at this time.

## 2014-12-31 NOTE — Progress Notes (Signed)
59 year old male pt admitted on voluntary basis. On admission, pt reports on-going depression and suicidal ideation that has been persisting for the past few months to the point where he is ruminating on ways to kill himself. Pt does report that he has been taking his medications as prescribed but feels they are not helpful. Pt also endorses financial difficulties which exacerbated his depression. Pt denies any SI on admission and is able to contract for safety on the unit. Pt was oriented to the unit and safety maintained.

## 2014-12-31 NOTE — BHH Counselor (Signed)
Adult Comprehensive Assessment  Patient ID: Jonathan Pierce, male   DOB: 1955/08/29, 59 y.o.   MRN: [id number]  Information Source: Information source: Patient  Current Stressors:  Educational / Learning stressors: None Employment / Job issues: None Family Relationships: None Surveyor, quantity / Lack of resources (include bankruptcy): Struggling financially Housing / Lack of housing: None Physical health (include injuries & life threatening diseases): None Social relationships: None Substance abuse: None  Living/Environment/Situation:  Living Arrangements: Spouse/significant other Living conditions (as described by patient or guardian): Good How long has patient lived in current situation?: Eight years What is atmosphere in current home: Comfortable, Supportive  Family History:  Marital status: Married Number of Years Married: 33 What types of issues is patient dealing with in the relationship?: Everyday life issues but overall a good marriage Additional relationship information: N/A Does patient have children?: Yes How many children?: 3 How is patient's relationship with their children?: Good relationships  Childhood History:  By whom was/is the patient raised?: Both parents Additional childhood history information: Sometimes difficulty Description of patient's relationship with caregiver when they were a child: Good with other but difficult with father Patient's description of current relationship with people who raised him/her: Both parents are deceased Does patient have siblings?: Yes Number of Siblings: 8 Description of patient's current relationship with siblings: Good relationships Did patient suffer any verbal/emotional/physical/sexual abuse as a child?: No Did patient suffer from severe childhood neglect?: No Has patient ever been sexually abused/assaulted/raped as an adolescent or adult?: No Was the patient ever a victim of a crime or a disaster?: No Witnessed domestic  violence?: No Has patient been effected by domestic violence as an adult?: No  Education:  Highest grade of school patient has completed: Producer, television/film/video Currently a student?: No Learning disability?: No  Employment/Work Situation:   Employment situation: Employed Where is patient currently employed?: [employer] How long has patient been employed?: New employment Patient's job has been impacted by current illness: No What is the longest time patient has a held a job?: Six years Where was the patient employed at that time?: [employer] Has patient ever been in the [employer]?: Yes (Describe in comment) Garment/textile technologist) Has patient ever served in Buyer, retail?: No  Financial Resources:   Financial resources: Income from employment Does patient have a representative payee or guardian?: No  Alcohol/Substance Abuse:   What has been your use of drugs/alcohol within the last 12 months?: Denies If attempted suicide, did drugs/alcohol play a role in this?: No Alcohol/Substance Abuse Treatment Hx: Denies past history Has alcohol/substance abuse ever caused legal problems?: No  Social Support System:   Forensic psychologist System: None Describe Community Support System: N/A Type of faith/religion: Christian How does patient's faith help to cope with current illness?: Prayer  Leisure/Recreation:   Leisure and Hobbies: Listening to Music  Strengths/Needs:   What things does the patient do well?: Good work history In what areas does patient struggle / problems for patient: Scientist, 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  He will benefit from crisis stabilization, evaluation for medication, psycho-education groups for coping  skills development, group therapy and case management for discharge planning.     Torre Pikus, Joesph JulyQuylle Hairston. 12/31/2014

## 2014-12-31 NOTE — Tx Team (Addendum)
Interdisciplinary Treatment Plan Update (Adult)  Date:  12/31/2014  Time Reviewed:  9:41 AM   Progress in Treatment: Attending groups: Patient is attending groups. Participating in groups:  Patient engages in discussion Taking medication as prescribed:  Patient is taking medications Tolerating medication:  Patient is tolerating medications Family/Significant othe contact made:   No, will asked for consent to make collateral contact Patient understands diagnosis:Yes, patient understands diagnosis and need for treatment Discussing patient identified problems/goals with staff:  Yes, patient is able to express goals/problems Medical problems stabilized or resolved:  Yes Denies suicidal/homicidal ideation: Yes, patient is denying SI/HI. Issues/concerns per patient self-inventory:   Other:  Discharge Plan or Barriers:  Home with outpatient follow up to be determined  Reason for Continuation of Hospitalization: Anxiety Depression Medication stabilization   Comments:   Jonathan Pierce is an 59 y.o. male. Pt is oriented x 4 and cooperative during assessment. His wife is at bedside. He reports a depressed and anxious mood. His affect is depressed. Pt sts that he has had more frequent SI in the past 8 or 9 mos. Pt sts for the past few mos he has experienced SI "2 to 3" times daily. He says, "I'm starting to act on them." Pt reports he has been walking around the house with a knife lately. He says that his wife takes the knife away from him. Pt sts that he thinks "how soon I could do this and be done with it" referring to suicide. Pt reports he thinks of cutting himself or driving off a bridge or into traffic as a suicide attempt. He says that is afraid that he will harm or kill himself. Pt says he was in the [employer] for 6 yrs and he [facility] [name] management and therapy. He denies hx of suicide attempts and denies hx of inpatient admissions. Pt reports he drinks approx one six  pack of 12 oz beers once a week. Pt reports he first noticed depressive sxs in 1986. Pt denies there was any precipitating factor to onset of depression. Pt reports sister has been dx with schizophrenia and he asks Clinical research associatewriter, "Is this as bad as what she has?". Writer reassured pt that pt's sxs weren't indicative of schizophrenia. [facility] denies [facility] and no delusions noted. Wife reports pt has become confused over the past year and that he has become irritable with mood swings.   Additional comments:  Patient and CSW reviewed Patient Discharge Process Letter/Patient Involvement Form.  Patient verbalized understanding and signed form.  Patient and CSW also reviewed and identified patient's goals and treatment plan.  Patient verbalized understanding and agreed to plan.  Estimated length of stay:  3-5 days  New goal(s):  Review of initial/current patient goals per problem list:  Please see plan of careInterdisciplinary Treatment Plan Update (Adult)  Attendees: Patient 12/31/2014 9:41 AM   Family:   12/31/2014 9:41 AM   Physician:  Nehemiah MassedFernando Cobos, MD 12/31/2014 9:41 AM   Nursing:   Aloha GellKrista Dopson, RN 12/31/2014 9:41 AM   Clinical Social Worker:  Juline PatchQuylle Latika Kronick, LCSW 12/31/2014 9:41 AM   Clinical Social Worker:  Samuella BruinKristin Drinkard, LCSW-A 12/31/2014 9:41 AM   Case Manager:  Onnie BoerJennifer Clark, RN 12/31/2014 9:41 AM   Other:   Rodman KeyJanet Webb, RN 12/31/2014 9:41 AM  Other:  Liborio NixonPatrice White, RN 12/31/2014  9:41 AM   Other:  12/31/2014 9:41 AM   Other:  12/31/2014 9:41 AM   Other:  12/31/2014 9:41 AM  Other:  Leisa Lenz, Vesta Mixer Transition Team Coordinator 12/31/2014 9:41 AM   Other:   12/31/2014 9:41 AM   Other:  12/31/2014 9:41 AM   Other:   12/31/2014 9:41 AM    Scribe for Treatment Team:   Wynn Banker, 12/31/2014   9:41 AM

## 2014-12-31 NOTE — Tx Team (Signed)
Initial Interdisciplinary Treatment Plan   PATIENT STRESSORS: Financial difficulties feels medications are no longer effective   PATIENT STRENGTHS: Ability for insight Active sense of humor Average or above average intelligence Capable of independent living Communication skills General fund of knowledge Motivation for treatment/growth Supportive family/friends   PROBLEM LIST: Problem List/Patient Goals Date to be addressed Date deferred Reason deferred Estimated date of resolution  "My mood, I feel like throwing in the towel" 12/31/14     Depression 12/31/14     Suicidal Ideation 12/31/14                                          DISCHARGE CRITERIA:  Ability to meet basic life and health needs Improved stabilization in mood, thinking, and/or behavior Verbal commitment to aftercare and medication compliance  PRELIMINARY DISCHARGE PLAN: Attend aftercare/continuing care group Return to previous living arrangement  PATIENT/FAMIILY INVOLVEMENT: This treatment plan has been presented to and reviewed with the patient, Festus HoltsDavid Heine, and/or family member, .  The patient and family have been given the opportunity to ask questions and make suggestions.  Rik Wadel, [place] 12/31/2014, 1:07 AM

## 2014-12-31 NOTE — [facility] Group Notes (Signed)
[facility] LCSW Group Therapy  12/31/2014 4:22 PM  Type of Therapy:  Group Therapy  Participation Level:  Did Not Attend - invited but did not attend.  [name], Jonathan Pierce 12/31/2014, 4:22 PM

## 2015-01-01 MED ORDER — CITALOPRAM HYDROBROMIDE 20 MG PO TABS
20.0000 mg | ORAL_TABLET | Freq: Every day | ORAL | Status: DC
  Administered 2015-01-02: 20 mg via ORAL
  Filled 2015-01-01: qty 1
  Filled 2015-01-01: qty 14
  Filled 2015-01-01 (×2): qty 1

## 2015-01-01 MED ORDER — MIRTAZAPINE 7.5 MG PO TABS
7.5000 mg | ORAL_TABLET | Freq: Every day | ORAL | Status: DC
  Administered 2015-01-01: 7.5 mg via ORAL
  Filled 2015-01-01 (×3): qty 1
  Filled 2015-01-01: qty 14
  Filled 2015-01-01: qty 1

## 2015-01-01 NOTE — Progress Notes (Signed)
Did not attend group 

## 2015-01-01 NOTE — [facility] Group Notes (Signed)
[facility]   01/01/2015 10:14 AM    Participation Quality:  Appropraite  Mood/Affect:  Appropriate  Depression Rating:  2  Anxiety Rating:  2  Thoughts of Suicide:  No  Will you contract for safety?   NA  Current AVH:  No  Plan for Discharge/Comments:  Patient attended discharge planning group and actively participated in group.  [facility] and [facility] home with his wife. Suicide prevention education reviewed and SPE document provided.   Transportation Means: Patient has transportation.   Supports:  Patient has a support system.   Laiba Fuerte, Joesph JulyQuylle Hairston

## 2015-01-01 NOTE — [facility] Suicide Risk Assessment (Signed)
[facility] INPATIENT:  Family/Significant Other Suicide Prevention Education  Suicide Prevention Education:  Education Completed; Jonathan Pierce, Wife [phone]- [phone]; has been identified by the patient as the family member/significant other with whom the patient will be residing, and identified as the person(s) who will aid the patient in the event of a mental health crisis (suicidal ideations/suicide attempt).  With written consent from the patient, the family member/significant other has been provided the following suicide prevention education, prior to the and/or following the discharge of the patient.  The suicide prevention education provided includes the following:  Suicide risk factors  Suicide prevention and interventions  National Suicide Hotline telephone number  [facility] assessment telephone number  [facility] Emergency Assistance 911  [facility] and/or Residential Mobile Crisis Unit telephone number  Request made of family/significant other to:  Remove weapons (e.g., guns, rifles, knives), all items previously/currently identified as safety concern.   Wife advised patient does not have access to weapons.     Remove drugs/medications (over-the-counter, prescriptions, illicit drugs), all items previously/currently identified as a safety concern.  The family member/significant other verbalizes understanding of the suicide prevention education information provided.  The family member/significant other agrees to remove the items of safety concern listed above.  [name], Lorcan Shelp Hairston 01/01/2015, 10:50 AM

## 2015-01-01 NOTE — Progress Notes (Signed)
Recreation Therapy Notes  Date: 06.08.16 Time: 9:30am Location: [address] Group room  Group Topic: Stress Management  Goal Area(s) Addresses:  Patient will verbalize importance of using healthy stress management.  Patient will identify positive emotions associated with healthy stress management.   Intervention: Stress Management  Activity :  Guided Imagery.  LRT introduced and educated patients on stress management technique of guided imagery.  A script was used to deliver the technique to patients.  Patients were asked to follow script read by LRT to engage in practicing the stress management technique.  Education:  Stress Management, Discharge Planning.   Education Outcome: Acknowledges edcuation/In group clarification offered/Needs additional education  Clinical Observations/Feedback: Patient did not attend group.   Zaylei Mullane, LRT/CTRS         Silvano Garofano A 01/01/2015 2:29 PM 

## 2015-01-01 NOTE — [facility] Group Notes (Signed)
[facility] LCSW Group Therapy  01/01/2015 3:45 PM  Type of Therapy:  Group Therapy  Participation Level:  Did Not Attend  Adie Vilar Hairston 01/01/2015, 3:45 PM  

## 2015-01-01 NOTE — Progress Notes (Signed)
[facility] MD Progress Note  01/01/2015 5:38 PM Jonathan Pierce  MRN:  [id number] Subjective:  Patient states that he is feeling better and tends to minimize the severity of his depression today. Denies medication side effects.  Objective: I've discussed case with treatment team and met with patient.  Patient has been noted to present with sad mood and constricted affect. He also tends to remain isolated in his room. Has noted he feels better but affect remains constricted and during our meeting/session he admitted he was "pretty depressed". Denies medication side effects. Denies suicidal thoughts. Sleep is "okay", appetite is fair, energy level is decreased.  No disruptive behaviors on unit.  Principal Problem: MDD (major depressive disorder), recurrent episode, severe Diagnosis:   Patient Active Problem List   Diagnosis Date Noted  . MDD (major depressive disorder), recurrent episode, severe [F33.2] 12/31/2014   Total Time spent with patient: 20 minutes   Past Medical History:  Past Medical History  Diagnosis Date  . Depression   . Seizures    History reviewed. No pertinent past surgical history. Family History: History reviewed. No pertinent family history. Social History:  History  Alcohol Use  . Yes    Comment: occasional 6 pack a week     History  Drug Use No    History   Social History  . Marital Status: Single    Spouse Name: N/A  . Number of Children: N/A  . Years of Education: N/A   Social History Main Topics  . Smoking status: Current Every Day Smoker -- 1.00 packs/day    Types: Cigarettes  . Smokeless tobacco: Not on file  . Alcohol Use: Yes     Comment: occasional 6 pack a week  . Drug Use: No  . Sexual Activity: Not on file   Other Topics Concern  . None   Social History Narrative   Additional History:    Sleep: Fair  Appetite:  Fair   Assessment:   Musculoskeletal: Strength & Muscle Tone: within normal limits Gait & Station: normal Patient leans:  N/A   Psychiatric Specialty Exam: Physical Exam  ROS  Blood pressure 154/85, pulse 57, temperature 98.7 F (37.1 C), temperature source Oral, resp. rate 18, height 5' 7" (1.702 m), weight 157 lb (71.215 kg).Body mass index is 24.58 kg/(m^2).  General Appearance: Fairly Groomed  Engineer, water::  Fair  Speech:  Slow  Volume:  Decreased  Mood:  Depressed  Affect:  Constricted  Thought Process:  Linear  Orientation:  Full (Time, Place, and Person)  Thought Content:  Denies hallucinations, no delusions  Suicidal Thoughts:  No at this time denies plan or intention of hurting self  Homicidal Thoughts:  No  Memory:  Recent and remote, grossly intact  Judgement:  Fair  Insight:  Fair  Psychomotor Activity:  Decreased  Concentration:  Good  Recall:  Good  Fund of Knowledge:Good  Language: Good  Akathisia:  No  Handed:  Right    AIMS (if indicated):     Assets:  Communication Skills Desire for Improvement Resilience  ADL's:  fair  Cognition: WNL  Sleep:   Fair     Current Medications: Current Facility-Administered Medications  Medication Dose Route Frequency Provider Last Rate Last Dose  . acetaminophen (TYLENOL) tablet 650 mg  650 mg Oral Q6H PRN Laverle Hobby, PA-C      . alum & mag hydroxide-simeth (MAALOX/MYLANTA) 200-200-20 MG/5ML suspension 30 mL  30 mL Oral Q4H PRN Laverle Hobby, PA-C      .  citalopram (CELEXA) tablet 10 mg  10 mg Oral Daily Jenne Campus, MD   10 mg at 01/01/15 0750  . hydrOXYzine (ATARAX/VISTARIL) tablet 25 mg  25 mg Oral Q6H PRN Laverle Hobby, PA-C   25 mg at 12/31/14 0245  . levETIRAcetam (KEPPRA) tablet 2,000 mg  2,000 mg Oral BID Laverle Hobby, PA-C   2,000 mg at 01/01/15 1655  . magnesium hydroxide (MILK OF MAGNESIA) suspension 30 mL  30 mL Oral Daily PRN Laverle Hobby, PA-C      . traZODone (DESYREL) tablet 50 mg  50 mg Oral QHS,MR X 1 Laverle Hobby, PA-C   50 mg at 12/31/14 2110    Lab Results:  Results for orders placed or  performed during the hospital encounter of 12/30/14 (from the past 48 hour(s))  Urine rapid drug screen (hosp performed)not at [facility]     Status: None   Collection Time: 12/30/14  5:39 PM  Result Value Ref Range   Opiates NONE DETECTED NONE DETECTED   Cocaine NONE DETECTED NONE DETECTED   Benzodiazepines NONE DETECTED NONE DETECTED   Amphetamines NONE DETECTED NONE DETECTED   Tetrahydrocannabinol NONE DETECTED NONE DETECTED   Barbiturates NONE DETECTED NONE DETECTED    Comment:        DRUG SCREEN FOR MEDICAL PURPOSES ONLY.  IF CONFIRMATION IS NEEDED FOR ANY PURPOSE, NOTIFY LAB WITHIN 5 DAYS.        LOWEST DETECTABLE LIMITS FOR URINE DRUG SCREEN Drug Class       Cutoff (ng/mL) Amphetamine      1000 Barbiturate      200 Benzodiazepine   242 Tricyclics       353 Opiates          300 Cocaine          300 THC              50     Physical Findings: AIMS: Facial and Oral Movements Muscles of Facial Expression: None, normal Lips and Perioral Area: None, normal Jaw: None, normal Tongue: None, normal,Extremity Movements Upper (arms, wrists, hands, fingers): None, normal Lower (legs, knees, ankles, toes): None, normal, Trunk Movements Neck, shoulders, hips: None, normal, Overall Severity Severity of abnormal movements (highest score from questions above): None, normal Incapacitation due to abnormal movements: None, normal Patient's awareness of abnormal movements (rate only patient's report): No Awareness, Dental Status Current problems with teeth and/or dentures?: No Does patient usually wear dentures?: No  CIWA:    COWS:      Assessment: Patient is reporting improvement but mood is still depressed and affect quite constricted. Ongoing neural-vegitative symptoms of depression to include low energy, disrupted appetite. Not suicidal, tolerating medication well. No seizures on unit. As noted, has a long history of seizure disorder stable on Keppra.   Treatment Plan Summary: Daily  contact with patient to assess and evaluate symptoms and progress in treatment, Medication management, Plan Ongoing inpatient admission and Medications as below   Increase Celexa to 20 mgs QDAY to address depression  Start Remeron 7.5 mgs QHS to address depression (may also help appetite) Continue Keppra for history of seizure disorder  Medical Decision Making:  Established Problem, Stable/Improving (1), Review of Psycho-Social Stressors (1), Review or order clinical lab tests (1) and Review of New Medication or Change in Dosage (2)     COBOS, FERNANDO 01/01/2015, 5:38 PM

## 2015-01-01 NOTE — Progress Notes (Signed)
D   Pt spent all evening in his room and he does not socialize with others   He refuses some groups and tends to isolate   He is sad and anxious  He takes his medications without difficulty and reports feeling some better A   Verbal support given   Medications administered and effectiveness monitored   Q 15 min checks  Continue to encourage group attendance and socialization R   Pt safe at present but not receptive at this time to socialization

## 2015-01-01 NOTE — Progress Notes (Signed)
D. Pt has been in room and in bed for much of the evening. Minimal interaction or participation with staff and peers. Pt does appear depressed and also endorsed feeling depressed. Pt spoke of his day and spoke about feeling depressed. Pt discussed with staff that he will be receiving a new medication in the morning to help address his depression. A. Support provided, medication education provided. R. Safety maintained, will continue to monitor.

## 2015-01-01 NOTE — Progress Notes (Signed)
D: Pt presents with flat affect and depressed. Pt appears sad on approach. Pt rates depression 2/10. Hopeless 2/10. Anxiety 3/10. Pt reported on his self inventory sheet that he is having passive suicidal thoughts. Pt denies suicidal thoughts with RN. Pt verbally contracts not to harm self. Pt appears to be minimizing his symptoms. Pt isolates in his room and have little interaction on the unit. Pt compliant with taking meds. No adverse reactions to meds verbalized by pt.  A: Medications administered as ordered per MD. Verbal support given. Pt encouraged to attend groups. 15 minute checks performed for safety.  R: Pt receptive to treatment.

## 2015-01-02 DIAGNOSIS — F332 Major depressive disorder, recurrent severe without psychotic features: Secondary | ICD-10-CM | POA: Insufficient documentation

## 2015-01-02 MED ORDER — LEVETIRACETAM 1000 MG PO TABS: 2000.0000 mg | ORAL_TABLET | Freq: Two times a day (BID) | ORAL | Status: DC

## 2015-01-02 MED ORDER — HYDROXYZINE HCL 25 MG PO TABS: 25.0000 mg | ORAL_TABLET | Freq: Four times a day (QID) | ORAL | Status: DC | PRN

## 2015-01-02 MED ORDER — TRAZODONE HCL 50 MG PO TABS: 50.0000 mg | ORAL_TABLET | Freq: Every evening | ORAL | Status: DC | PRN

## 2015-01-02 MED ORDER — MIRTAZAPINE 7.5 MG PO TABS: 7.5000 mg | ORAL_TABLET | Freq: Every day | ORAL | Status: DC

## 2015-01-02 MED ORDER — CITALOPRAM HYDROBROMIDE 20 MG PO TABS: 20.0000 mg | ORAL_TABLET | Freq: Every day | ORAL | Status: DC

## 2015-01-02 MED ORDER — NICOTINE 21 MG/24HR TD PT24
21.0000 mg | MEDICATED_PATCH | Freq: Every day | TRANSDERMAL | Status: DC
  Filled 2015-01-02 (×2): qty 14

## 2015-01-02 NOTE — Progress Notes (Signed)
Discharge note: Pt received both written and verbal discharge instructions. Pt verbalized understanding of discharge instructions. [facility]. Pt denies suicidal thoughts at time of discharge. Pt presents pleasant and noted to be engaging with staff and other pts prior to discharge. Pt received sample meds, prescriptions and belongings from room. Pt safely left [facility] with son in law.

## 2015-01-02 NOTE — Discharge Summary (Signed)
Physician Discharge Summary Note  Patient:  Jonathan Pierce is an 59 y.o., male MRN:  [id number] DOB:  12-20-1955 Patient phone:  [phone] (home)  Patient address:   [address] [place] [address],  Total Time spent with patient: 45 minutes  Date of Admission:  12/31/2014 Date of Discharge: 01/02/2015  Reason for Admission:  depression  Principal Problem: MDD (major depressive disorder), recurrent episode, severe Discharge Diagnoses: Patient Active Problem List   Diagnosis Date Noted  . Major depressive disorder, recurrent, severe without psychotic features [F33.2]   . MDD (major depressive disorder), recurrent episode, severe [F33.2] 12/31/2014    Musculoskeletal: Strength & Muscle Tone: within normal limits Gait & Station: normal Patient leans: N/A  Psychiatric Specialty Exam: Physical Exam  Vitals reviewed.   Review of Systems  All other systems reviewed and are negative.   Blood pressure 138/80, pulse 61, temperature 97.8 F (36.6 C), temperature source Oral, resp. rate 18, height  (1.702 m), weight 71.215 kg (157 lb).Body mass index is 24.58 kg/(m^2).  Have you used any form of tobacco in the last 30 days? (Cigarettes, Smokeless Tobacco, Cigars, and/or Pipes): Yes  Has this patient used any form of tobacco in the last 30 days? (Cigarettes, Smokeless Tobacco, Cigars, and/or Pipes) N/A  Past Medical History:  Past Medical History  Diagnosis Date  . Depression   . Seizures    History reviewed. No pertinent past surgical history. Family History: History reviewed. No pertinent family history. Social History:  History  Alcohol Use  . Yes    Comment: occasional 6 pack a week     History  Drug Use No    History   Social History  . Marital Status: Single    Spouse Name: N/A  . Number of Children: N/A  . Years of Education: N/A   Social History Main Topics  . Smoking status: Current Every Day Smoker -- 1.00 packs/day    Types: Cigarettes  .  Smokeless tobacco: Not on file  . Alcohol Use: Yes     Comment: occasional 6 pack a week  . Drug Use: No  . Sexual Activity: Not on file   Other Topics Concern  . None   Social History Narrative    Past Psychiatric History: Hospitalizations:  Outpatient Care:  Substance Abuse Care:  Self-Mutilation:  Suicidal Attempts:  Violent Behaviors:   Risk to Self: Is patient at risk for suicide?: Yes What has been your use of drugs/alcohol within the last 12 months?: Denies Risk to Others:   Prior Inpatient Therapy:   Prior Outpatient Therapy:    Level of Care:  OP  Hospital Course:  Jonathan Pierce, 59 yr old, veteran, married (30 yrs), good relationship with children was seen in the ED for suicidal ideation. He states he had a knife in his hand, his wife came home and he asked her to bring him to the hospital. He was being seen by a "outpatient practitioner at the [place], he had been taking Paroxetine for 30 years. They asked me to take 1 whole pill, but I always take 1/2. He reports that his depressed mood had worsened steadily in the last 7-8 months. He had a job (4 years) that he was miserable in and he lost that job this past May. He is at a new job currently and is anxious about being let go because he was admitted here. He states he notified them.   He denies diabetes and hypertension. He does have a  seizure disorder that he takes Keppra for. He denies ETOH, cocaine and cannabis. He denies experiencing bipolar mood symptoms and psychotic symptoms. He does stats that he had a sister who was on meds for schizophrenia. He verbalized various times in the last few years wherein he wants to walk into traffic, jump off a bridge and go to sleep and never to wake up. He does still enjoy listening to music which is a favorite past time.   Consults:  psychiatry  Significant Diagnostic Studies:  labs: per ED  Discharge Vitals:   Blood pressure 138/80, pulse 61, temperature 97.8 F  (36.6 C), temperature source Oral, resp. rate 18, height 5\' 7"  (1.702 m), weight 71.215 kg (157 lb). Body mass index is 24.58 kg/(m^2). Lab Results:   Results for orders placed or performed during the hospital encounter of 12/30/14 (from the past 72 hour(s))  Urine rapid drug screen (hosp performed)not at [facility]     Status: None   Collection Time: 12/30/14  5:39 PM  Result Value Ref Range   Opiates NONE DETECTED NONE DETECTED   Cocaine NONE DETECTED NONE DETECTED   Benzodiazepines NONE DETECTED NONE DETECTED   Amphetamines NONE DETECTED NONE DETECTED   Tetrahydrocannabinol NONE DETECTED NONE DETECTED   Barbiturates NONE DETECTED NONE DETECTED    Comment:        DRUG SCREEN FOR MEDICAL PURPOSES ONLY.  IF CONFIRMATION IS NEEDED FOR ANY PURPOSE, NOTIFY LAB WITHIN 5 DAYS.        LOWEST DETECTABLE LIMITS FOR URINE DRUG SCREEN Drug Class       Cutoff (ng/mL) Amphetamine      1000 Barbiturate      200 Benzodiazepine   200 Tricyclics       300 Opiates          300 Cocaine          300 THC              50     Physical Findings: AIMS: Facial and Oral Movements Muscles of Facial Expression: None, normal Lips and Perioral Area: None, normal Jaw: None, normal Tongue: None, normal,Extremity Movements Upper (arms, wrists, hands, fingers): None, normal Lower (legs, knees, ankles, toes): None, normal, Trunk Movements Neck, shoulders, hips: None, normal, Overall Severity Severity of abnormal movements (highest score from questions above): None, normal Incapacitation due to abnormal movements: None, normal Patient's awareness of abnormal movements (rate only patient's report): No Awareness, Dental Status Current problems with teeth and/or dentures?: No Does patient usually wear dentures?: No  CIWA:    COWS:      See Psychiatric Specialty Exam and Suicide Risk Assessment completed by Attending Physician prior to discharge.  Discharge destination:  Home  Is patient on multiple  antipsychotic therapies at discharge:  No   Has Patient had three or more failed trials of antipsychotic monotherapy by history:  No    Recommended Plan for Multiple Antipsychotic Therapies: NA     Medication List    ASK your doctor about these medications      Indication   lamoTRIgine 100 MG tablet  Commonly known as:  LAMICTAL  Take 100 mg by mouth 2 (two) times daily.      levETIRAcetam 500 MG tablet  Commonly known as:  KEPPRA  Take 2,000 mg by mouth 2 (two) times daily.      PARoxetine 40 MG tablet  Commonly known as:  PAXIL  Take 40 mg by mouth every morning.  Follow-up Information    Follow up with [facility] .   Why:  Appointment with Everardo Beals for mental health services on Wednesday June 15th at 11 am. Appointment with primary care doctor Dr. Tarri Fuller on Thursday June 16th at 1pm.  Please call office if you need to reschedule.   Contact information:   [address] [place], [place] [address] [phone]      Follow-up recommendations:  Activity:  as tol, diet as tol  Comments:  1.  Take all your medications as prescribed.              2.  Report any adverse side effects to outpatient provider.                       3.  Patient instructed to not use alcohol or illegal drugs while on prescription medicines.            4.  In the event of worsening symptoms, instructed patient to call 911, the crisis hotline or go to nearest emergency room for evaluation of symptoms.  Total Discharge Time: 40 min  Signed: Velna Hatchet May Agustin AGNP-BC 01/02/2015, 2:36 PM   Patient seen, Suicide Assessment Completed.  Disposition Plan Reviewed

## 2015-01-02 NOTE — [facility] Group Notes (Signed)
[facility] LCSW Group Therapy Group Therapy:  [facility]  1:15 - 2: 30 PM   01/02/2015   Type of Therapy: Group Therapy  Participation Level: Appropriate  Participation Quality: Invited, chose not to attend.  Santa Genera, LCSW Clinical Social Worker 01/02/2015 2:04 PM

## 2015-01-02 NOTE — [facility] Suicide Risk Assessment (Signed)
[facility] Discharge Suicide Risk Assessment   Demographic Factors:  59 year old man, marries, lives with wife, employed, Sales executive.  Total Time spent with patient: 30 minutes  Musculoskeletal: Strength & Muscle Tone: within normal limits Gait & Station: normal Patient leans: N/A  Psychiatric Specialty Exam: Physical Exam  ROS  Blood pressure 138/80, pulse 61, temperature 97.8 F (36.6 C), temperature source Oral, resp. rate 18, height  (1.702 m), weight 157 lb (71.215 kg).Body mass index is 24.58 kg/(m^2).  General Appearance: Well Groomed  Eye Contact::  Good  Speech:  Normal Rate409  Volume:  Normal  Mood:  states he feels better, currently minimizes depression  Affect:  more reactive   Thought Process:  Goal Directed and Linear  Orientation:  Full (Time, Place, and Person)  Thought Content:  no hallucinations, no delusions  Suicidal Thoughts:  No  Homicidal Thoughts:  No  Memory:  recent and remote grossly intact   Judgement:  Other:  improved  Insight:  Present  Psychomotor Activity:  Normal  Concentration:  Good  Recall:  Good  Fund of Knowledge:Good  Language: Good  Akathisia:  Negative  Handed:  Right  AIMS (if indicated):     Assets:  Communication Skills Desire for Improvement Resilience Social Support  Sleep:     Cognition: WNL  ADL's:  Improved    Have you used any form of tobacco in the last 30 days? (Cigarettes, Smokeless Tobacco, Cigars, and/or Pipes): Yes  Has this patient used any form of tobacco in the last 30 days? (Cigarettes, Smokeless Tobacco, Cigars, and/or Pipes)  We reviewed the importance and benefits of smoking cessation , and patient agrees to Nicoderm patches   Mental Status Per Nursing Assessment::   On Admission:     Current Mental Status by Physician: At this time patient is improved compared to admission, mood improved, affect is still constricted but more reactive, smiles at times  Appropriately, no thought disorder, no SI  or HI, no psychotic symptoms, future oriented.   Loss Factors: Had recently lost his job , financial stressors   Historical Factors: History of depression, no prior history of suicide attempts   Risk Reduction Factors:   Sense of responsibility to family, Employed, Living with another person, especially a relative and Positive coping skills or problem solving skills  Continued Clinical Symptoms:  At this time improved compared to admission- mood and affect improved, no SI or HI, no psychotic symptoms.  Cognitive Features That Contribute To Risk:  No gross cognitive deficits noted upon discharge. Is alert , attentive, and oriented x 3   Suicide Risk:  Mild:  Suicidal ideation of limited frequency, intensity, duration, and specificity.  There are no identifiable plans, no associated intent, mild dysphoria and related symptoms, good self-control (both objective and subjective assessment), few other risk factors, and identifiable protective factors, including available and accessible social support.  Principal Problem: MDD (major depressive disorder), recurrent episode, severe Discharge Diagnoses:  Patient Active Problem List   Diagnosis Date Noted  . MDD (major depressive disorder), recurrent episode, severe [F33.2] 12/31/2014    Follow-up Information    Follow up with [facility] .   Why:  Appointment with Everardo Beals for mental health services on Wednesday June 15th at 11 am. Appointment with primary care doctor Dr. Tarri Fuller on Thursday June 16th at 1pm.  Please call office if you need to reschedule.   Contact information:   [address] [place], [place] [address] [phone]  Plan Of Care/Follow-up recommendations:  Activity:  as tolerated Diet:  Regular Tests:  NA Other:  See below  Is patient on multiple antipsychotic therapies at discharge:  No   Has Patient had three or more failed trials of antipsychotic monotherapy by history:  No  Recommended Plan  for Multiple Antipsychotic Therapies: NA   Patient is requesting discharge, and there are no grounds for involuntary commitment at this time. At this time patient leaving in good spirits. Plans to return home.  Plans to follow up as above .    Haislee Corso 01/02/2015, 1:28 PM

## 2015-01-02 NOTE — Progress Notes (Addendum)
  [facility] Adult Case Management Discharge Plan :  Will you be returning to the same living situation after discharge:  Yes,  Patient plans to return home At discharge, do you have transportation home?: Yes,  patient reports access to transportation Do you have the ability to pay for your medications: Yes,  patient will be provided with medication samples and prescriptions  Release of information consent forms completed and in the chart;  Patient's signature needed at discharge.  Patient to Follow up at: Follow-up Information    Follow up with [facility] .   Why:  Appointment with Everardo Beals for mental health services on Wednesday June 15th at 11 am. Appointment with primary care doctor Dr. Tarri Fuller on Thursday June 16th at 1pm.  Please call office if you need to reschedule.   Contact information:   [address] [place], [place] [address] [phone]      Patient denies SI/HI: Yes,  denies    Safety Planning and Suicide Prevention discussed: Yes,  with patient and wife  Have you used any form of tobacco in the last 30 days? (Cigarettes, Smokeless Tobacco, Cigars, and/or Pipes): Yes  Has patient been referred to the Quitline?: Yes, faxed 6/9  Ellina Sivertsen, Belenda Cruise L 01/02/2015, 1:10 PM

## 2015-03-03 ENCOUNTER — Encounter (HOSPITAL_COMMUNITY): Payer: Self-pay | Admitting: Emergency Medicine

## 2015-03-03 ENCOUNTER — Emergency Department (HOSPITAL_COMMUNITY)
Admission: EM | Admit: 2015-03-03 | Discharge: 2015-03-04 | Disposition: A | Discharge status: Other Acute Inpatient Hospital | Payer: Non-veteran care | Attending: Emergency Medicine | Admitting: Emergency Medicine

## 2015-03-03 DIAGNOSIS — Z720 Tobacco use: Secondary | ICD-10-CM | POA: Insufficient documentation

## 2015-03-03 DIAGNOSIS — Y998 Other external cause status: Secondary | ICD-10-CM | POA: Insufficient documentation

## 2015-03-03 DIAGNOSIS — T5192XA Toxic effect of unspecified alcohol, intentional self-harm, initial encounter: Secondary | ICD-10-CM | POA: Diagnosis not present

## 2015-03-03 DIAGNOSIS — F1010 Alcohol abuse, uncomplicated: Secondary | ICD-10-CM | POA: Diagnosis not present

## 2015-03-03 DIAGNOSIS — G40909 Epilepsy, unspecified, not intractable, without status epilepticus: Secondary | ICD-10-CM | POA: Insufficient documentation

## 2015-03-03 DIAGNOSIS — F332 Major depressive disorder, recurrent severe without psychotic features: Secondary | ICD-10-CM | POA: Diagnosis not present

## 2015-03-03 DIAGNOSIS — Y9289 Other specified places as the place of occurrence of the external cause: Secondary | ICD-10-CM | POA: Diagnosis not present

## 2015-03-03 DIAGNOSIS — T43022A Poisoning by tetracyclic antidepressants, intentional self-harm, initial encounter: Secondary | ICD-10-CM | POA: Insufficient documentation

## 2015-03-03 DIAGNOSIS — T50902A Poisoning by unspecified drugs, medicaments and biological substances, intentional self-harm, initial encounter: Secondary | ICD-10-CM

## 2015-03-03 DIAGNOSIS — Y9389 Activity, other specified: Secondary | ICD-10-CM | POA: Insufficient documentation

## 2015-03-03 DIAGNOSIS — T43212A Poisoning by selective serotonin and norepinephrine reuptake inhibitors, intentional self-harm, initial encounter: Secondary | ICD-10-CM | POA: Insufficient documentation

## 2015-03-03 DIAGNOSIS — Z79899 Other long term (current) drug therapy: Secondary | ICD-10-CM | POA: Insufficient documentation

## 2015-03-03 DIAGNOSIS — F329 Major depressive disorder, single episode, unspecified: Secondary | ICD-10-CM | POA: Insufficient documentation

## 2015-03-03 DIAGNOSIS — T43222A Poisoning by selective serotonin reuptake inhibitors, intentional self-harm, initial encounter: Secondary | ICD-10-CM | POA: Insufficient documentation

## 2015-03-03 DIAGNOSIS — R45851 Suicidal ideations: Secondary | ICD-10-CM | POA: Diagnosis not present

## 2015-03-03 LAB — COMPREHENSIVE METABOLIC PANEL
ALK PHOS: 56 U/L (ref 38–126)
ALT: 13 U/L — ABNORMAL LOW (ref 17–63)
ANION GAP: 7 (ref 5–15)
AST: 12 U/L — AB (ref 15–41)
Albumin: 2.6 g/dL — ABNORMAL LOW (ref 3.5–5.0)
BUN: 9 mg/dL (ref 6–20)
CO2: 27 mmol/L (ref 22–32)
Calcium: 8.7 mg/dL — ABNORMAL LOW (ref 8.9–10.3)
Chloride: 113 mmol/L — ABNORMAL HIGH (ref 101–111)
Creatinine, Ser: 0.8 mg/dL (ref 0.61–1.24)
GFR calc Af Amer: >60 mL/min (ref >60)
GFR calc non Af Amer: >60 mL/min (ref >60)
GLUCOSE: 110 mg/dL — AB (ref 65–99)
Potassium: 3.2 mmol/L — ABNORMAL LOW (ref 3.5–5.1)
SODIUM: 147 mmol/L — AB (ref 135–145)
Total Bilirubin: 0.2 mg/dL — ABNORMAL LOW (ref 0.3–1.2)
Total Protein: 5.7 g/dL — ABNORMAL LOW (ref 6.5–8.1)

## 2015-03-03 LAB — RAPID URINE DRUG SCREEN, HOSP PERFORMED
AMPHETAMINES: NOT DETECTED
BARBITURATES: NOT DETECTED
Benzodiazepines: NOT DETECTED
Cocaine: NOT DETECTED
Opiates: NOT DETECTED
Tetrahydrocannabinol: NOT DETECTED

## 2015-03-03 LAB — CBC
HEMATOCRIT: 38.7 % — AB (ref 39.0–52.0)
HEMOGLOBIN: 12.4 g/dL — AB (ref 13.0–17.0)
MCH: 27.7 pg (ref 26.0–34.0)
MCHC: 32 g/dL (ref 30.0–36.0)
MCV: 86.4 fL (ref 78.0–100.0)
Platelets: 317 10*3/uL (ref 150–400)
RBC: 4.48 MIL/uL (ref 4.22–5.81)
RDW: 15.2 % (ref 11.5–15.5)
WBC: 7 10*3/uL (ref 4.0–10.5)

## 2015-03-03 LAB — ETHANOL: Alcohol, Ethyl (B): 156 mg/dL — ABNORMAL HIGH (ref <5)

## 2015-03-03 LAB — ACETAMINOPHEN LEVEL

## 2015-03-03 LAB — SALICYLATE LEVEL: Salicylate Lvl: <4 mg/dL (ref 2.8–30.0)

## 2015-03-03 MED ORDER — VITAMIN B-1 100 MG PO TABS
100.0000 mg | ORAL_TABLET | Freq: Every day | ORAL | Status: DC
  Administered 2015-03-04: 100 mg via ORAL
  Filled 2015-03-03: qty 1

## 2015-03-03 MED ORDER — LORAZEPAM 1 MG PO TABS: 0.0000 mg | ORAL_TABLET | Freq: Two times a day (BID) | ORAL | Status: DC

## 2015-03-03 MED ORDER — ACETAMINOPHEN 325 MG PO TABS: 650.0000 mg | ORAL_TABLET | ORAL | Status: DC | PRN

## 2015-03-03 MED ORDER — LORAZEPAM 1 MG PO TABS: 1.0000 mg | ORAL_TABLET | Freq: Three times a day (TID) | ORAL | Status: DC | PRN

## 2015-03-03 MED ORDER — LORAZEPAM 1 MG PO TABS: 0.0000 mg | ORAL_TABLET | Freq: Four times a day (QID) | ORAL | Status: DC

## 2015-03-03 MED ORDER — THIAMINE HCL 100 MG/ML IJ SOLN: 100.0000 mg | Freq: Every day | INTRAMUSCULAR | Status: DC

## 2015-03-03 NOTE — ED Provider Notes (Signed)
CSN: [id number]     Arrival date & time 03/03/15  1650 History   First MD Initiated Contact with Patient 03/03/15 1711     Chief Complaint  Patient presents with  . Drug Overdose     (Consider location/radiation/quality/duration/timing/severity/associated sxs/prior Treatment) Patient is a 59 y.o. Pierce presenting with Overdose and mental health disorder.  Drug Overdose This is a new problem. The current episode started 1 to 2 hours ago. Episode frequency: once.  Mental Health Problem Presenting symptoms: suicide attempt   Degree of incapacity (severity):  Severe Onset quality:  Gradual Duration: several months. Timing:  Constant Progression:  Worsening Context: alcohol use   Relieved by:  Nothing Worsened by:  Nothing tried Associated symptoms: feelings of worthlessness and poor judgment     Past Medical History  Diagnosis Date  . Depression   . Seizures    History reviewed. No pertinent past surgical history. History reviewed. No pertinent family history. History  Substance Use Topics  . Smoking status: Current Every Day Smoker -- 1.00 packs/day    Types: Cigarettes  . Smokeless tobacco: Not on file  . Alcohol Use: Yes     Comment: occasional 6 pack a week    Review of Systems  All other systems reviewed and are negative.     Allergies  Review of patient's allergies indicates no known allergies.  Home Medications   Prior to Admission medications   Medication Sig Start Date End Date Taking? Authorizing Provider  citalopram (CELEXA) 20 MG tablet Take 1 tablet (20 mg total) by mouth daily. 01/02/15  Yes Adonis Brook, NP  hydrOXYzine (ATARAX/VISTARIL) 25 MG tablet Take 1 tablet (25 mg total) by mouth every 6 (six) hours as needed for anxiety. 01/02/15  Yes Adonis Brook, NP  mirtazapine (REMERON) 7.5 MG tablet Take 1 tablet (7.5 mg total) by mouth at bedtime. 01/02/15  Yes Adonis Brook, NP  traZODone (DESYREL) 50 MG tablet Take 1 tablet (50 mg total) by mouth at  bedtime and may repeat dose one time if needed. Patient taking differently: Take 50 mg by mouth at bedtime and may repeat dose one time if needed. sleep 01/02/15  Yes Adonis Brook, NP  levETIRAcetam (KEPPRA) 1000 MG tablet Take 2 tablets (2,000 mg total) by mouth 2 (two) times daily. 01/02/15   Adonis Brook, NP   BP 130/74 mmHg  Pulse 79  Temp(Src) 98 F (36.7 C) (Oral)  Resp 12  SpO2 97% Physical Exam  Constitutional: He is oriented to person, place, and time. He appears well-developed and well-nourished. No distress.  HENT:  Head: Normocephalic and atraumatic.  Eyes: Conjunctivae are normal. No scleral icterus.  Neck: Neck supple.  Cardiovascular: Normal rate and intact distal pulses.   Pulmonary/Chest: Effort normal. No stridor. No respiratory distress.  Abdominal: Normal appearance. He exhibits no distension. There is no tenderness. There is no rebound and no guarding.  Neurological: He is alert and oriented to person, place, and time.  Skin: Skin is warm and dry. No rash noted.  Psychiatric: His behavior is normal. He exhibits a depressed mood. He expresses suicidal ideation.  Slurred speech  Nursing note and vitals reviewed.   ED Course  Procedures (including critical care time) Labs Review Labs Reviewed  COMPREHENSIVE METABOLIC PANEL - Abnormal; Notable for the following:    Sodium 147 (*)    Potassium 3.2 (*)    Chloride 113 (*)    Glucose, Bld 110 (*)    Calcium 8.7 (*)    Total  Protein 5.7 (*)    Albumin 2.6 (*)    AST 12 (*)    ALT 13 (*)    Total Bilirubin 0.2 (*)    All other components within normal limits  ETHANOL - Abnormal; Notable for the following:    Alcohol, Ethyl (B) 156 (*)    All other components within normal limits  ACETAMINOPHEN LEVEL - Abnormal; Notable for the following:    Acetaminophen (Tylenol), Serum <10 (*)    All other components within normal limits  CBC - Abnormal; Notable for the following:    Hemoglobin 12.4 (*)    HCT 38.7 (*)     All other components within normal limits  SALICYLATE LEVEL  URINE RAPID DRUG SCREEN, HOSP PERFORMED    Imaging Review No results found.   EKG Interpretation   Date/Time:  Monday March 03 2015 16:56:09 EDT Ventricular Rate:  80 PR Interval:  161 QRS Duration: 91 QT Interval:  363 QTC Calculation: 419 R Axis:   63 Text Interpretation:  Sinus rhythm Probable LVH with secondary repol abnrm  Baseline wander in lead(s) V3 V4 nonspecific t wave changes. no STEMI.  Confirmed by Donnald Garre, MD, Lebron Conners [phone]) on 03/03/2015 5:51:52 PM      MDM   Final diagnoses:  Drug overdose, intentional self-harm, initial encounter    Pt took an overdose of citalopram (40 mg 14), trazodone (50 mg 30), mirtazapine (15 mg 15), and alcohol and attempt to kill himself.  He denies taking any other medications or substances. He was observed for greater than 8 hours per was taking control recommendations. Has remained asymptomatic. Medically clear for psychiatric evaluation.  Blake Divine, MD 03/03/15 [phone]

## 2015-03-03 NOTE — ED Notes (Addendum)
Pt states that he is suicidal/depressed and drank today along with taking 5 remeron, 30 trazadone and 15 celexa at approx. 1400. Alert and oriented. Neuro intact. Ambulating w/o difficulty. 20g LFA. 500ccNS given pta.

## 2015-03-03 NOTE — ED Notes (Addendum)
Poison control contacted regarding patients overdose, [phone] spoke to [employer]. Recommendation to monitor patient for 8 hours or more. Poison control to f/u

## 2015-03-03 NOTE — ED Notes (Signed)
Bed: [facility]01 Expected date:  Expected time:  Means of arrival:  Comments: EMS/Trazodone and Remeron OD/etoh

## 2015-03-04 DIAGNOSIS — F332 Major depressive disorder, recurrent severe without psychotic features: Secondary | ICD-10-CM

## 2015-03-04 DIAGNOSIS — T50901A Poisoning by unspecified drugs, medicaments and biological substances, accidental (unintentional), initial encounter: Secondary | ICD-10-CM | POA: Insufficient documentation

## 2015-03-04 DIAGNOSIS — R45851 Suicidal ideations: Secondary | ICD-10-CM | POA: Diagnosis not present

## 2015-03-04 DIAGNOSIS — F1010 Alcohol abuse, uncomplicated: Secondary | ICD-10-CM

## 2015-03-04 MED ORDER — POTASSIUM CHLORIDE CRYS ER 20 MEQ PO TBCR
40.0000 meq | EXTENDED_RELEASE_TABLET | Freq: Once | ORAL | Status: AC
  Administered 2015-03-04: 40 meq via ORAL
  Filled 2015-03-04: qty 2

## 2015-03-04 MED ORDER — LEVETIRACETAM 750 MG PO TABS
2000.0000 mg | ORAL_TABLET | Freq: Two times a day (BID) | ORAL | Status: DC
  Filled 2015-03-04 (×2): qty 1

## 2015-03-04 MED ORDER — LAMOTRIGINE 25 MG PO TABS
25.0000 mg | ORAL_TABLET | Freq: Two times a day (BID) | ORAL | Status: DC
  Administered 2015-03-04: 25 mg via ORAL
  Filled 2015-03-04 (×2): qty 1

## 2015-03-04 MED ORDER — MIRTAZAPINE 7.5 MG PO TABS
7.5000 mg | ORAL_TABLET | Freq: Every day | ORAL | Status: DC
  Filled 2015-03-04: qty 1

## 2015-03-04 MED ORDER — LEVETIRACETAM 500 MG PO TABS
2000.0000 mg | ORAL_TABLET | Freq: Two times a day (BID) | ORAL | Status: DC
  Administered 2015-03-04: 2000 mg via ORAL
  Filled 2015-03-04 (×2): qty 4

## 2015-03-04 MED ORDER — VITAMIN D (ERGOCALCIFEROL) 1.25 MG (50000 UNIT) PO CAPS: 50000.0000 [IU] | ORAL_CAPSULE | ORAL | Status: DC

## 2015-03-04 MED ORDER — TRAZODONE HCL 50 MG PO TABS: 50.0000 mg | ORAL_TABLET | Freq: Every evening | ORAL | Status: DC | PRN

## 2015-03-04 MED ORDER — CITALOPRAM HYDROBROMIDE 20 MG PO TABS
20.0000 mg | ORAL_TABLET | Freq: Every day | ORAL | Status: DC
  Administered 2015-03-04: 20 mg via ORAL
  Filled 2015-03-04: qty 1

## 2015-03-04 MED ORDER — LAMOTRIGINE 100 MG PO TABS: 100.0000 mg | ORAL_TABLET | Freq: Two times a day (BID) | ORAL | Status: DC

## 2015-03-04 MED ORDER — HYDROXYZINE HCL 25 MG PO TABS: 25.0000 mg | ORAL_TABLET | Freq: Four times a day (QID) | ORAL | Status: DC | PRN

## 2015-03-04 NOTE — ED Notes (Signed)
Bed: [facility] Expected date: 03/04/15 Expected time: 1:28 AM Means of arrival:  Comments: Hold for [employer]

## 2015-03-04 NOTE — ED Notes (Signed)
Patient appears flat, cooperative. Denies HI, AVH. Reports SI. Rates anxiety 1/10, feelings of depression 8/10. Reports trouble falling and remaining asleep in recent weeks. Denies any changes in weight or appetite.   Encouragement offered.   Q 15 safety checks in place.

## 2015-03-04 NOTE — Consult Note (Signed)
[facility] Consult   Reason for Consult:  MDD, Recurrent, severe without Psychosis, Suicide attempt,  Referring Physician:  EDP Patient Identification: Jonathan Pierce MRN:  [id number] Principal Diagnosis: Major depressive disorder, recurrent, severe without psychotic features Diagnosis:   Patient Active Problem List   Diagnosis Date Noted  . Alcohol abuse [F10.10] 03/04/2015  . Major depressive disorder, recurrent, severe without psychotic features [F33.2]   . MDD (major depressive disorder), recurrent episode, severe [F33.2] 12/31/2014    Total Time spent with patient: 1 hour  Subjective:   Jonathan Pierce is a 59 y.o. male patient admitted with  MDD, Recurrent, severe without Psychosis, Suicide attempt,.  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  He also drank 8 bottles of Beer yesterday.  Patient admitted this morning that she took the medications to kill herself.  Patient stated that he want to kill himself because he feels worthless and hopeless.   He was hospitalized in June at our [facility], he receives treatment at the [place] in [place].  Patient reports that he has been treated for Alcoholism in the past.  He reports poor sleep and fair appetite.  Patient reports that he is employed, is married and have children.  Patient admits to feeling suicidal and stated "nothing good is going on for me now"  He has been accepted for admission and we will be seeking placement at any facility with available beds.  HPI Elements:   Location:  MDD, Recurrent, severe without Psychosis, suicidal attempt. Quality:  severe, suicide attempt, feelings of worthlessness and hoplelessness. Severity:  severe. Timing:  Acute. Duration:  Chronic mental illness. Context:  Brought after OD on his medications..  Past Medical History:  Past Medical History   Diagnosis Date  . Depression   . Seizures    History reviewed. No pertinent past surgical history. Family History: History reviewed. No pertinent family history. Social History:  History  Alcohol Use  . Yes    Comment: occasional 6 pack a week     History  Drug Use No    History   Social History  . Marital Status: Single    Spouse Name: N/A  . Number of Children: N/A  . Years of Education: N/A   Social History Main Topics  . Smoking status: Current Every Day Smoker -- 1.00 packs/day    Types: Cigarettes  . Smokeless tobacco: Not on file  . Alcohol Use: Yes     Comment: occasional 6 pack a week  . Drug Use: No  . Sexual Activity: Not on file   Other Topics Concern  . None   Social History Narrative   Additional Social History:    Pain Medications: None Prescriptions: Trazadone, remeron & celexa were what he overdosed on today. Over the Counter: None History of alcohol / drug use?: Yes Name of Substance 1: ETOH (beer) 1 - Age of First Use: 59 years of age 55 - Amount (size/oz): Will drink a 6 pack twice in a week. 1 - Frequency: Twice weekly 1 - Duration: On-going for the last 18 months. 1 - Last Use / Amount: 08/08 seven beers                   Allergies:  No Known Allergies  Labs:  Results for orders placed or performed during  the hospital encounter of 03/03/15 (from the past 48 hour(s))  Urine rapid drug screen (hosp performed) (Not at [facility])     Status: None   Collection Time: 03/03/15  5:12 PM  Result Value Ref Range   Opiates NONE DETECTED NONE DETECTED   Cocaine NONE DETECTED NONE DETECTED   Benzodiazepines NONE DETECTED NONE DETECTED   Amphetamines NONE DETECTED NONE DETECTED   Tetrahydrocannabinol NONE DETECTED NONE DETECTED   Barbiturates NONE DETECTED NONE DETECTED    Comment:        DRUG SCREEN FOR MEDICAL PURPOSES ONLY.  IF CONFIRMATION IS NEEDED FOR ANY PURPOSE, NOTIFY LAB WITHIN 5 DAYS.        LOWEST DETECTABLE LIMITS FOR URINE  DRUG SCREEN Drug Class       Cutoff (ng/mL) Amphetamine      1000 Barbiturate      200 Benzodiazepine   272 Tricyclics       536 Opiates          300 Cocaine          300 THC              50   Comprehensive metabolic panel     Status: Abnormal   Collection Time: 03/03/15  5:36 PM  Result Value Ref Range   Sodium 147 (H) 135 - 145 mmol/L   Potassium 3.2 (L) 3.5 - 5.1 mmol/L   Chloride 113 (H) 101 - 111 mmol/L   CO2 27 22 - 32 mmol/L   Glucose, Bld 110 (H) 65 - 99 mg/dL   BUN 9 6 - 20 mg/dL   Creatinine, Ser 0.80 0.61 - 1.24 mg/dL   Calcium 8.7 (L) 8.9 - 10.3 mg/dL   Total Protein 5.7 (L) 6.5 - 8.1 g/dL   Albumin 2.6 (L) 3.5 - 5.0 g/dL   AST 12 (L) 15 - 41 U/L   ALT 13 (L) 17 - 63 U/L   Alkaline Phosphatase 56 38 - 126 U/L   Total Bilirubin 0.2 (L) 0.3 - 1.2 mg/dL   GFR calc non Af Amer >60 >60 mL/min   GFR calc Af Amer >60 >60 mL/min    Comment: (NOTE) The eGFR has been calculated using the CKD EPI equation. This calculation has not been validated in all clinical situations. eGFR's persistently <60 mL/min signify possible Chronic Kidney Disease.    Anion gap 7 5 - 15  Ethanol (ETOH)     Status: Abnormal   Collection Time: 03/03/15  5:36 PM  Result Value Ref Range   Alcohol, Ethyl (B) 156 (H) <5 mg/dL    Comment:        LOWEST DETECTABLE LIMIT FOR SERUM ALCOHOL IS 5 mg/dL FOR MEDICAL PURPOSES ONLY   Salicylate level     Status: None   Collection Time: 03/03/15  5:36 PM  Result Value Ref Range   Salicylate Lvl <6.4 2.8 - 30.0 mg/dL  Acetaminophen level     Status: Abnormal   Collection Time: 03/03/15  5:36 PM  Result Value Ref Range   Acetaminophen (Tylenol), Serum <10 (L) 10 - 30 ug/mL    Comment:        THERAPEUTIC CONCENTRATIONS VARY SIGNIFICANTLY. A RANGE OF 10-30 ug/mL MAY BE AN EFFECTIVE CONCENTRATION FOR MANY PATIENTS. HOWEVER, SOME ARE BEST TREATED AT CONCENTRATIONS OUTSIDE THIS RANGE. ACETAMINOPHEN CONCENTRATIONS >150 ug/mL AT 4 HOURS  AFTER INGESTION AND >50 ug/mL AT 12 HOURS AFTER INGESTION ARE OFTEN ASSOCIATED WITH TOXIC REACTIONS.   CBC  Status: Abnormal   Collection Time: 03/03/15  5:36 PM  Result Value Ref Range   WBC 7.0 4.0 - 10.5 K/uL   RBC 4.48 4.22 - 5.81 MIL/uL   Hemoglobin 12.4 (L) 13.0 - 17.0 g/dL   HCT 38.7 (L) 39.0 - 52.0 %   MCV 86.4 78.0 - 100.0 fL   MCH 27.7 26.0 - 34.0 pg   MCHC 32.0 30.0 - 36.0 g/dL   RDW 15.2 11.5 - 15.5 %   Platelets 317 150 - 400 K/uL    Vitals: Blood pressure 126/69, pulse 60, temperature 98.4 F (36.9 C), temperature source Oral, resp. rate 18, SpO2 100 %.  Risk to Self: Suicidal Ideation: Yes-Currently Present Suicidal Intent: Yes-Currently Present Is patient at risk for suicide?: Yes Suicidal Plan?: Yes-Currently Present Specify Current Suicidal Plan: Overdose on meds Access to Means: Yes Specify Access to Suicidal Means: Medications What has been your use of drugs/alcohol within the last 12 months?: Drinks beer on regular basis How many times?: 0 Other Self Harm Risks: None Triggers for Past Attempts: Unpredictable Intentional Self Injurious Behavior: None Risk to Others: Homicidal Ideation: No Thoughts of Harm to Others: No Current Homicidal Intent: No Current Homicidal Plan: No Access to Homicidal Means: No Identified Victim: No one History of harm to others?: No Assessment of Violence: None Noted Violent Behavior Description: None reported Does patient have access to weapons?: No Criminal Charges Pending?: No Does patient have a court date: No Prior Inpatient Therapy: Prior Inpatient Therapy: No Prior Therapy Dates: None Prior Therapy Facilty/Provider(s): N/a Reason for Treatment: N/A Prior Outpatient Therapy: Prior Outpatient Therapy: Yes Prior Therapy Dates: 7 months [facility] [place] Reason for Treatment: Depression Does patient have an ACCT team?: No Does patient have Intensive In-House Services?  : No Does  patient have [facility] services? : No Does patient have P4CC services?: No  Current Facility-Administered Medications  Medication Dose Route Frequency Provider Last Rate Last Dose  . acetaminophen (TYLENOL) tablet 650 mg  650 mg Oral Q4H PRN Serita Grit, MD      . citalopram (CELEXA) tablet 20 mg  20 mg Oral Daily Nakota Elsen   20 mg at 03/04/15 1236  . hydrOXYzine (ATARAX/VISTARIL) tablet 25 mg  25 mg Oral Q6H PRN Crispin Vogel      . lamoTRIgine (LAMICTAL) tablet 25 mg  25 mg Oral BID Dalaysia Harms   25 mg at 03/04/15 1236  . levETIRAcetam (KEPPRA) tablet 2,000 mg  2,000 mg Oral BID Zoua Caporaso   2,000 mg at 03/04/15 1236  . LORazepam (ATIVAN) tablet 0-4 mg  0-4 mg Oral 4 times per day Serita Grit, MD   Stopped at 03/04/15 0650   Followed by  . [START ON 03/06/2015] LORazepam (ATIVAN) tablet 0-4 mg  0-4 mg Oral Q12H Serita Grit, MD      . LORazepam (ATIVAN) tablet 1 mg  1 mg Oral Q8H PRN Serita Grit, MD      . mirtazapine (REMERON) tablet 7.5 mg  7.5 mg Oral QHS Eira Alpert      . thiamine (VITAMIN B-1) tablet 100 mg  100 mg Oral Daily Serita Grit, MD   100 mg at 03/04/15 1011   Or  . thiamine (B-1) injection 100 mg  100 mg Intravenous Daily Serita Grit, MD      . traZODone (DESYREL) tablet 50 mg  50 mg Oral QHS,MR X 1 Veer Elamin      . [START ON 03/08/2015] Vitamin D (Ergocalciferol) (DRISDOL)  capsule 50,000 Units  50,000 Units Oral Q7 days Aleeya Veitch       Current Outpatient Prescriptions  Medication Sig Dispense Refill  . citalopram (CELEXA) 40 MG tablet Take 20 mg by mouth daily.    . hydrOXYzine (ATARAX/VISTARIL) 25 MG tablet Take 1 tablet (25 mg total) by mouth every 6 (six) hours as needed for anxiety. 30 tablet 0  . lamoTRIgine (LAMICTAL) 100 MG tablet Take 100 mg by mouth 2 (two) times daily.    Marland Kitchen levETIRAcetam (KEPPRA) 500 MG tablet Take 2,000 mg by mouth 2 (two) times daily.    . mirtazapine (REMERON) 15 MG tablet Take 7.5 mg by mouth at bedtime.     . mirtazapine (REMERON) 7.5 MG tablet Take 1 tablet (7.5 mg total) by mouth at bedtime. 30 tablet 0  . traZODone (DESYREL) 50 MG tablet Take 1 tablet (50 mg total) by mouth at bedtime and may repeat dose one time if needed. (Patient taking differently: Take 50 mg by mouth at bedtime as needed for sleep. sleep) 30 tablet 0  . Vitamin D, Ergocalciferol, (DRISDOL) 50000 UNITS CAPS capsule Take 50,000 Units by mouth every 7 (seven) days.    . citalopram (CELEXA) 20 MG tablet Take 1 tablet (20 mg total) by mouth daily. (Patient not taking: Reported on 03/04/2015) 30 tablet 0  . levETIRAcetam (KEPPRA) 1000 MG tablet Take 2 tablets (2,000 mg total) by mouth 2 (two) times daily. (Patient not taking: Reported on 03/03/2015) 120 tablet 0    Musculoskeletal: Strength & Muscle Tone: within normal limits Gait & Station: normal Patient leans: N/A  Psychiatric Specialty Exam: Physical Exam  Review of Systems  Constitutional: Negative.   HENT: Negative.   Eyes: Negative.   Respiratory: Negative.   Cardiovascular: Negative.   Gastrointestinal: Negative.   Musculoskeletal: Negative.   Skin: Negative.   Neurological: Negative.   Endo/Heme/Allergies: Negative.     Blood pressure 126/69, pulse 60, temperature 98.4 F (36.9 C), temperature source Oral, resp. rate 18, SpO2 100 %.There is no weight on file to calculate BMI.  General Appearance: Casual  Eye Contact::  Minimal  Speech:  Clear and Coherent and Normal Rate  Volume:  Normal  Mood:  Anxious, Depressed and Worthless  Affect:  Congruent, Depressed and Flat  Thought Process:  Coherent, Goal Directed and Intact  Orientation:  Full (Time, Place, and Person)  Thought Content:  WDL  Suicidal Thoughts:  Yes.  without intent/plan  Homicidal Thoughts:  No  Memory:  Immediate;   Good Recent;   Good Remote;   Good  Judgement:  Fair  Insight:  Shallow  Psychomotor Activity:  Psychomotor Retardation  Concentration:  Good  Recall:  Good  Fund of  Knowledge:Fair  Language: Good  Akathisia:  NA  Handed:  Right  AIMS (if indicated):     Assets:  Desire for Improvement  ADL's:  Intact  Cognition: WNL  Sleep:      Medical Decision Making: Established Problem, Stable/Improving (1)  Treatment Plan Summary: Daily contact with patient to assess and evaluate symptoms and progress in treatment and Medication management  Plan:  Resume all home medications, We will use Vistaril 25 mg every 6 hours as needed for anxiety and we will use our Ativan protocol for his Detox from Alcohol. Disposition: Admit and seek placement  Delfin Gant  PMHNP-BC 03/04/2015 2:22 PM Patient seen face-to-face for psychiatric evaluation, chart reviewed and case discussed with the physician extender and developed treatment plan. Reviewed the information  documented and agree with the treatment plan. Corena Pilgrim, MD

## 2015-03-04 NOTE — Progress Notes (Signed)
Seeking inpatient psych placement for pt. (Also considered for admission to [facility] upon bed availability)  Referred to: [place]- per Miki Kins- per Antonieta Loveless- per [facility] (for waitlist)- per Carmin Muskrat- per Minus Liberty, MSW, LCSW Clinical Social Work, Disposition  03/04/2015 [phone]

## 2015-03-04 NOTE — Progress Notes (Addendum)
Pt recommended for inpatient adult/geri psych treatment.  CSW contacted [facility] to inquire about beds-Per [phone] ext [phone], no mental health beds available at any of the [facility] and to seek civilian hospitalization.    Olga Coaster, LCSW  Clinical Social Work  [employer] [phone]

## 2015-03-04 NOTE — Progress Notes (Addendum)
Patient was accepted at [facility], per [facility].  Accepting MD [facility], room 158 bed 1, patient to arrive any time between now and 23:00, or after 6am on 8/10 and bed will be saved for patient.  Call report when transportation is ready at [phone].  Patient's nurse informed.  Melbourne Abts, LCSWA Disposition staff 03/04/2015 4:05 PM

## 2015-03-04 NOTE — BH Assessment (Addendum)
Tele Assessment Note   Jonathan Pierce is an 59 y.o. male.  -Clinician talked with Dr. Loretha Stapler about need for TTS.  Patient has taken 5 remeron, 30 trazadone & 15 celexa in an effort to kill himself.  Took these with ETOH.  Patient is quiet and flat in affect.  He admits that he was trying to kill himself with his medications.  He says "I just got too tired of being depressed."  Patient cannot identify a specific thing that drove his to attempt to kill himself.  Patient is willing to get help.  When asked why he went ahead and called EMS hs says "I started to think about my family.."  Pt denies any HI or A/V hallucinations.  No previous suicide attempts.  Patient reports that he does drink a 6 pack at a time about twice weekly for the past year and a half.  Patient reports no withdrawal symptoms.  Pt has been going to the [place] in [place] for the last seven months.  He was admitted to [facility] in June 2016 for suicidal ideations.  -Clinician discussed patient care with Donell Sievert, PA.  He recommends inpatient care on [address] hall if admitted to [facility].  Dr. Loretha Stapler and clinician had discussed that patient would need inpatient care.  Axis I: Major Depression, single episode Axis II: Deferred Axis III:  Past Medical History  Diagnosis Date  . Depression   . Seizures    Axis IV: occupational problems, other psychosocial or environmental problems and problems related to social environment Axis V: 31-40 impairment in reality testing  Past Medical History:  Past Medical History  Diagnosis Date  . Depression   . Seizures     History reviewed. No pertinent past surgical history.  Family History: History reviewed. No pertinent family history.  Social History:  reports that he has been smoking Cigarettes.  He has been smoking about 1.00 pack per day. He does not have any smokeless tobacco history on file. He reports that he drinks alcohol. He reports that he does not use illicit drugs.  Additional  Social History:  Alcohol / Drug Use Pain Medications: None Prescriptions: Trazadone, remeron & celexa were what he overdosed on today. Over the Counter: None History of alcohol / drug use?: Yes Substance #1 Name of Substance 1: ETOH (beer) 1 - Age of First Use: 59 years of age 80 - Amount (size/oz): Will drink a 6 pack twice in a week. 1 - Frequency: Twice weekly 1 - Duration: On-going for the last 18 months. 1 - Last Use / Amount: 08/08 seven beers  CIWA: CIWA-Ar BP: 111/69 mmHg Pulse Rate: 65 COWS:    PATIENT STRENGTHS: (choose at least two) Average or above average intelligence Capable of independent living Communication skills Supportive family/friends  Allergies: No Known Allergies  Home Medications:  (Not in a hospital admission)  OB/GYN Status:  No LMP for male patient.  General Assessment Data [facility]: [facility] TTS Assessment: In system Is this a Tele or Face-to-Face Assessment?: Face-to-Face Is this an Initial Assessment or a Re-assessment for this encounter?: Initial Assessment Marital status: Married Is patient pregnant?: No Pregnancy Status: No Living Arrangements: Spouse/significant other Can pt return to current living arrangement?: Yes Admission Status: Voluntary Is patient capable of signing voluntary admission?: Yes Referral Source: Self/Family/Friend (Pt called EMS after taking the overdose of celexa, remeron &) Insurance type: VA benefits     Crisis Care Plan Living Arrangements: Spouse/significant other Name of Psychiatrist: [place] in  [place] [facility]rapist: [facility] [place]  Education Status Is patient currently in school?: No Highest grade of school patient has completed: 2 years of college  Risk to self with the past 6 months Suicidal Ideation: Yes-Currently Present Has patient been a risk to self within the past 6 months prior to admission? : Yes Suicidal Intent: Yes-Currently Present Has patient had any suicidal intent within  the past 6 months prior to admission? : Yes Is patient at risk for suicide?: Yes Suicidal Plan?: Yes-Currently Present Has patient had any suicidal plan within the past 6 months prior to admission? : Yes Specify Current Suicidal Plan: Overdose on meds Access to Means: Yes Specify Access to Suicidal Means: Medications What has been your use of drugs/alcohol within the last 12 months?: Drinks beer on regular basis Previous Attempts/Gestures: No How many times?: 0 Other Self Harm Risks: None Triggers for Past Attempts: Unpredictable Intentional Self Injurious Behavior: None Family Suicide History: No (Family hx of depression.) Recent stressful life event(s): Other (Comment) (Pt cannot identify a specific stressor) Persecutory voices/beliefs?: No Depression: Yes Depression Symptoms: Despondent, Isolating, Insomnia, Guilt, Loss of interest in usual pleasures, Feeling worthless/self pity, Feeling angry/irritable Substance abuse history and/or treatment for substance abuse?: No Suicide prevention information given to non-admitted patients: Not applicable  Risk to Others within the past 6 months Homicidal Ideation: No Does patient have any lifetime risk of violence toward others beyond the six months prior to admission? : No Thoughts of Harm to Others: No Current Homicidal Intent: No Current Homicidal Plan: No Access to Homicidal Means: No Identified Victim: No one History of harm to others?: No Assessment of Violence: None Noted Violent Behavior Description: None reported Does patient have access to weapons?: No Criminal Charges Pending?: No Does patient have a court date: No Is patient on probation?: No  Psychosis Hallucinations: None noted Delusions: None noted  Mental Status Report Appearance/Hygiene: Disheveled, In scrubs Eye Contact: Poor Motor Activity: Freedom of movement, Unremarkable Speech: Logical/coherent, Soft Level of Consciousness: Drowsy Mood: Depressed,  Helpless, Sad Affect: Blunted, Depressed, Sad Anxiety Level: None Thought Processes: Coherent, Relevant Judgement: Unimpaired Orientation: Person, Place, Time, Situation Obsessive Compulsive Thoughts/Behaviors: None  Cognitive Functioning Concentration: Decreased Memory: Recent Impaired, Remote Impaired IQ: Average Insight: Fair Impulse Control: Poor Appetite: Fair Weight Loss: 0 Weight Gain: 0 Sleep: Decreased Total Hours of Sleep:  (<6H/D) Vegetative Symptoms: None  ADLScreening [facility] Assessment Services) Patient's cognitive ability adequate to safely complete daily activities?: Yes Patient able to express need for assistance with ADLs?: Yes Independently performs ADLs?: Yes (appropriate for developmental age)  Prior Inpatient Therapy Prior Inpatient Therapy: No Prior Therapy Dates: None Prior Therapy Facilty/Provider(s): N/a Reason for Treatment: N/A  Prior Outpatient Therapy Prior Outpatient Therapy: Yes Prior Therapy Dates: 7 months [facility] [place] Reason for Treatment: Depression Does patient have an ACCT team?: No Does patient have Intensive In-House Services?  : No Does patient have [facility] services? : No Does patient have P4CC services?: No  ADL Screening (condition at time of admission) Patient's cognitive ability adequate to safely complete daily activities?: Yes Is the patient deaf or have difficulty hearing?: No Does the patient have difficulty seeing, even when wearing glasses/contacts?: No Does the patient have difficulty concentrating, remembering, or making decisions?: No Patient able to express need for assistance with ADLs?: Yes Does the patient have difficulty dressing or bathing?: No Independently performs ADLs?: Yes (appropriate for developmental age) Does the patient have difficulty walking or climbing stairs?: No  Weakness of Legs: None Weakness of Arms/Hands: None       Abuse/Neglect Assessment (Assessment  to be complete while patient is alone) Physical Abuse: Denies Verbal Abuse: Denies Sexual Abuse: Denies Exploitation of patient/patient's resources: Denies Self-Neglect: Denies     Merchant navy officer (For Healthcare) Does patient have an advance directive?: No Would patient like information on creating an advanced directive?: No - patient declined information    Additional Information 1:1 In Past 12 Months?: No CIRT Risk: No Elopement Risk: No Does patient have medical clearance?: Yes     Disposition:  Disposition Initial Assessment Completed for this Encounter: Yes Disposition of Patient: Inpatient treatment program, Referred to Type of inpatient treatment program: Adult Patient referred to:  (Pt to be reviewed with PA.)  Beatriz Stallion Ray 03/04/2015 2:00 AM

## 2015-03-04 NOTE — ED Notes (Signed)
Report to Sport and exercise psychologist at [facility].

## 2015-03-04 NOTE — BH Assessment (Signed)
Per Kelly AC, pt appears appropriate for [facility], however, nursing staff is critically low until 1100. Pt will be reviewed by Tina AC, later in AM for possible placement at [facility].   Geniyah Eischeid, LPC Triage Specialist 03/04/2015 5:02 AM 

## 2018-01-06 ENCOUNTER — Inpatient Hospital Stay (HOSPITAL_COMMUNITY)
Admission: EM | Admit: 2018-01-06 | Discharge: 2018-01-09 | DRG: 683 | Disposition: A | Discharge status: Home / Self Care | Payer: No Typology Code available for payment source | Attending: Family Medicine | Admitting: Family Medicine

## 2018-01-06 ENCOUNTER — Encounter (HOSPITAL_COMMUNITY): Payer: Self-pay | Admitting: Nurse Practitioner

## 2018-01-06 DIAGNOSIS — E785 Hyperlipidemia, unspecified: Secondary | ICD-10-CM | POA: Diagnosis present

## 2018-01-06 DIAGNOSIS — Z87441 Personal history of nephrotic syndrome: Secondary | ICD-10-CM

## 2018-01-06 DIAGNOSIS — N049 Nephrotic syndrome with unspecified morphologic changes: Secondary | ICD-10-CM | POA: Diagnosis present

## 2018-01-06 DIAGNOSIS — D649 Anemia, unspecified: Secondary | ICD-10-CM | POA: Diagnosis present

## 2018-01-06 DIAGNOSIS — F419 Anxiety disorder, unspecified: Secondary | ICD-10-CM | POA: Diagnosis present

## 2018-01-06 DIAGNOSIS — Z833 Family history of diabetes mellitus: Secondary | ICD-10-CM

## 2018-01-06 DIAGNOSIS — N022 Recurrent and persistent hematuria with diffuse membranous glomerulonephritis: Secondary | ICD-10-CM | POA: Diagnosis present

## 2018-01-06 DIAGNOSIS — F332 Major depressive disorder, recurrent severe without psychotic features: Secondary | ICD-10-CM | POA: Diagnosis present

## 2018-01-06 DIAGNOSIS — Z720 Tobacco use: Secondary | ICD-10-CM | POA: Diagnosis present

## 2018-01-06 DIAGNOSIS — N179 Acute kidney failure, unspecified: Secondary | ICD-10-CM | POA: Diagnosis not present

## 2018-01-06 DIAGNOSIS — M109 Gout, unspecified: Secondary | ICD-10-CM | POA: Diagnosis present

## 2018-01-06 DIAGNOSIS — I10 Essential (primary) hypertension: Secondary | ICD-10-CM

## 2018-01-06 DIAGNOSIS — F17210 Nicotine dependence, cigarettes, uncomplicated: Secondary | ICD-10-CM | POA: Diagnosis present

## 2018-01-06 DIAGNOSIS — G40909 Epilepsy, unspecified, not intractable, without status epilepticus: Secondary | ICD-10-CM | POA: Diagnosis present

## 2018-01-06 DIAGNOSIS — E876 Hypokalemia: Secondary | ICD-10-CM | POA: Diagnosis present

## 2018-01-06 DIAGNOSIS — I161 Hypertensive emergency: Secondary | ICD-10-CM | POA: Diagnosis present

## 2018-01-06 LAB — COMPREHENSIVE METABOLIC PANEL
ALT: 16 U/L — ABNORMAL LOW (ref 17–63)
ANION GAP: 6 (ref 5–15)
AST: 12 U/L — AB (ref 15–41)
Albumin: 2.5 g/dL — ABNORMAL LOW (ref 3.5–5.0)
Alkaline Phosphatase: 62 U/L (ref 38–126)
BUN: 29 mg/dL — AB (ref 6–20)
CHLORIDE: 114 mmol/L — AB (ref 101–111)
CO2: 23 mmol/L (ref 22–32)
Calcium: 8.6 mg/dL — ABNORMAL LOW (ref 8.9–10.3)
Creatinine, Ser: 2.82 mg/dL — ABNORMAL HIGH (ref 0.61–1.24)
GFR calc Af Amer: 26 mL/min — ABNORMAL LOW (ref >60)
GFR calc non Af Amer: 23 mL/min — ABNORMAL LOW (ref >60)
Glucose, Bld: 121 mg/dL — ABNORMAL HIGH (ref 65–99)
POTASSIUM: 3.5 mmol/L (ref 3.5–5.1)
SODIUM: 143 mmol/L (ref 135–145)
Total Bilirubin: 0.3 mg/dL (ref 0.3–1.2)
Total Protein: 6.3 g/dL — ABNORMAL LOW (ref 6.5–8.1)

## 2018-01-06 LAB — CBC
HCT: 34.4 % — ABNORMAL LOW (ref 39.0–52.0)
HEMOGLOBIN: 11.2 g/dL — AB (ref 13.0–17.0)
MCH: 27.5 pg (ref 26.0–34.0)
MCHC: 32.6 g/dL (ref 30.0–36.0)
MCV: 84.5 fL (ref 78.0–100.0)
Platelets: 350 10*3/uL (ref 150–400)
RBC: 4.07 MIL/uL — ABNORMAL LOW (ref 4.22–5.81)
RDW: 15.4 % (ref 11.5–15.5)
WBC: 11.3 10*3/uL — ABNORMAL HIGH (ref 4.0–10.5)

## 2018-01-06 LAB — URINALYSIS, ROUTINE W REFLEX MICROSCOPIC
BILIRUBIN URINE: NEGATIVE
Bacteria, UA: NONE SEEN
Glucose, UA: 150 mg/dL — AB
Hgb urine dipstick: NEGATIVE
KETONES UR: NEGATIVE mg/dL
Leukocytes, UA: NEGATIVE
Nitrite: NEGATIVE
PH: 6 (ref 5.0–8.0)
Protein, ur: >=300 mg/dL — AB
SPECIFIC GRAVITY, URINE: 1.014 (ref 1.005–1.030)

## 2018-01-06 LAB — LIPASE, BLOOD: Lipase: 36 U/L (ref 11–51)

## 2018-01-06 MED ORDER — HYDRALAZINE HCL 20 MG/ML IJ SOLN
20.0000 mg | Freq: Once | INTRAMUSCULAR | Status: AC
  Administered 2018-01-06: 20 mg via INTRAVENOUS
  Filled 2018-01-06: qty 1

## 2018-01-06 NOTE — ED Notes (Signed)
Pt aware urine sample is needed 

## 2018-01-06 NOTE — ED Triage Notes (Signed)
Pt states he was called and advised to come to [facility]. States he was advised that his Kidney functions were alarmingly in decline. Pt has no complaints at this time.

## 2018-01-06 NOTE — ED Provider Notes (Signed)
[facility]-EMERGENCY DEPT Provider Note   CSN: [id number] Arrival date & time: 01/06/18  2048     History   Chief Complaint Chief Complaint  Patient presents with  . Sent By Nephrology  . Hypertension    HPI Jonathan Pierce is a 62 y.o. male.  Patient sent to emergency department for evaluation of kidney failure. He has a history of nephrotic syndrome with preserved renal function (last creatinine 1.06 in Sept 2018). He had routine follow-up blood work at [place] and was called today and told to come to the hospital because his creatinine had increased to 2.95.  Patient denies blurred vision but does endorse slight headache. He has not had any chest pain, shortness of breath, swelling, back pain, hematuria.     Past Medical History:  Diagnosis Date  . Depression   . Seizures [facility])     Patient Active Problem List   Diagnosis Date Noted  . Alcohol abuse 03/04/2015  . Drug overdose   . Major depressive disorder, recurrent, severe without psychotic features (HCC)   . MDD (major depressive disorder), recurrent episode, severe (HCC) 12/31/2014    History reviewed. No pertinent surgical history.      Home Medications    Prior to Admission medications   Medication Sig Start Date End Date Taking? Authorizing Provider  citalopram (CELEXA) 20 MG tablet Take 1 tablet (20 mg total) by mouth daily. Patient not taking: Reported on 03/04/2015 01/02/15   Adonis Brook, NP  citalopram (CELEXA) 40 MG tablet Take 20 mg by mouth daily.    [provider]  hydrOXYzine (ATARAX/VISTARIL) 25 MG tablet Take 1 tablet (25 mg total) by mouth every 6 (six) hours as needed for anxiety. 01/02/15   Adonis Brook, NP  lamoTRIgine (LAMICTAL) 100 MG tablet Take 100 mg by mouth 2 (two) times daily.    [provider]  levETIRAcetam (KEPPRA) 1000 MG tablet Take 2 tablets (2,000 mg total) by mouth 2 (two) times daily. Patient not taking: Reported on 03/03/2015 01/02/15    Adonis Brook, NP  levETIRAcetam (KEPPRA) 500 MG tablet Take 2,000 mg by mouth 2 (two) times daily.    [provider]  traZODone (DESYREL) 50 MG tablet Take 1 tablet (50 mg total) by mouth at bedtime and may repeat dose one time if needed. Patient taking differently: Take 50 mg by mouth at bedtime as needed for sleep. sleep 01/02/15   Adonis Brook, NP  Vitamin D, Ergocalciferol, (DRISDOL) 50000 UNITS CAPS capsule Take 50,000 Units by mouth every 7 (seven) days.    [provider]    Family History History reviewed. No pertinent family history.  Social History Social History   Tobacco Use  . Smoking status: Current Every Day Smoker    Packs/day: 1.00    Types: Cigarettes  Substance Use Topics  . Alcohol use: Yes    Comment: occasional 6 pack a week  . Drug use: No     Allergies   Patient has no known allergies.   Review of Systems Review of Systems  Respiratory: Negative for shortness of breath.   Cardiovascular: Negative for chest pain.  All other systems reviewed and are negative.    Physical Exam Updated Vital Signs BP (!) 201/94 (BP Location: Left Arm)   Pulse 63   Temp 98.1 F (36.7 C) (Oral)   Resp 17   SpO2 100%   Physical Exam  Constitutional: He is oriented to person, place, and time. He appears well-developed and well-nourished.  No distress.  HENT:  Head: Normocephalic and atraumatic.  Right Ear: Hearing normal.  Left Ear: Hearing normal.  Nose: Nose normal.  Mouth/Throat: Oropharynx is clear and moist and mucous membranes are normal.  Eyes: Pupils are equal, round, and reactive to light. Conjunctivae and EOM are normal.  Neck: Normal range of motion. Neck supple.  Cardiovascular: Regular rhythm, S1 normal and S2 normal. Exam reveals no gallop and no friction rub.  No murmur heard. Pulmonary/Chest: Effort normal and breath sounds normal. No respiratory distress. He exhibits no tenderness.  Abdominal: Soft. Normal appearance and  bowel sounds are normal. There is no hepatosplenomegaly. There is no tenderness. There is no rebound, no guarding, no tenderness at McBurney's point and negative Murphy's sign. No hernia.  Musculoskeletal: Normal range of motion.  Neurological: He is alert and oriented to person, place, and time. He has normal strength. No cranial nerve deficit or sensory deficit. Coordination normal. GCS eye subscore is 4. GCS verbal subscore is 5. GCS motor subscore is 6.  Skin: Skin is warm, dry and intact. No rash noted. No cyanosis.  Psychiatric: He has a normal mood and affect. His speech is normal and behavior is normal. Thought content normal.  Nursing note and vitals reviewed.    ED Treatments / Results  Labs (all labs ordered are listed, but only abnormal results are displayed) Labs Reviewed  COMPREHENSIVE METABOLIC PANEL - Abnormal; Notable for the following components:      Result Value   Chloride 114 (*)    Glucose, Bld 121 (*)    BUN 29 (*)    Creatinine, Ser 2.82 (*)    Calcium 8.6 (*)    Total Protein 6.3 (*)    Albumin 2.5 (*)    AST 12 (*)    ALT 16 (*)    GFR calc non Af Amer 23 (*)    GFR calc Af Amer 26 (*)    All other components within normal limits  CBC - Abnormal; Notable for the following components:   WBC 11.3 (*)    RBC 4.07 (*)    Hemoglobin 11.2 (*)    HCT 34.4 (*)    All other components within normal limits  LIPASE, BLOOD  URINALYSIS, ROUTINE W REFLEX MICROSCOPIC    EKG None  Radiology No results found.  Procedures Procedures (including critical care time)  Medications Ordered in ED Medications - No data to display   Initial Impression / Assessment and Plan / ED Course  I have reviewed the triage vital signs and the nursing notes.  Pertinent labs & imaging results that were available during my care of the patient were reviewed by me and considered in my medical decision making (see chart for details).     Patient presents with acute renal  failure in the setting of uncontrolled hypertension and history of nephrotic syndrome with normal renal function. He is asymptomatic. Will require hospitalization for blood pressure control and further work up of kidney failure.  Final Clinical Impressions(s) / ED Diagnoses   Final diagnoses:  Uncontrolled hypertension  Acute renal failure, unspecified acute renal failure type [facility](HCC)    ED Discharge Orders    None       Gilda CreasePollina, Christopher J, MD 01/06/18 2336

## 2018-01-07 ENCOUNTER — Inpatient Hospital Stay (HOSPITAL_COMMUNITY): Payer: No Typology Code available for payment source

## 2018-01-07 ENCOUNTER — Encounter (HOSPITAL_COMMUNITY): Payer: Self-pay | Admitting: Internal Medicine

## 2018-01-07 ENCOUNTER — Other Ambulatory Visit: Payer: Self-pay

## 2018-01-07 DIAGNOSIS — I361 Nonrheumatic tricuspid (valve) insufficiency: Secondary | ICD-10-CM

## 2018-01-07 DIAGNOSIS — Z87441 Personal history of nephrotic syndrome: Secondary | ICD-10-CM | POA: Diagnosis not present

## 2018-01-07 DIAGNOSIS — F419 Anxiety disorder, unspecified: Secondary | ICD-10-CM | POA: Diagnosis present

## 2018-01-07 DIAGNOSIS — N049 Nephrotic syndrome with unspecified morphologic changes: Secondary | ICD-10-CM

## 2018-01-07 DIAGNOSIS — F332 Major depressive disorder, recurrent severe without psychotic features: Secondary | ICD-10-CM

## 2018-01-07 DIAGNOSIS — Z833 Family history of diabetes mellitus: Secondary | ICD-10-CM | POA: Diagnosis not present

## 2018-01-07 DIAGNOSIS — G40909 Epilepsy, unspecified, not intractable, without status epilepticus: Secondary | ICD-10-CM | POA: Diagnosis present

## 2018-01-07 DIAGNOSIS — I161 Hypertensive emergency: Secondary | ICD-10-CM | POA: Diagnosis present

## 2018-01-07 DIAGNOSIS — N179 Acute kidney failure, unspecified: Secondary | ICD-10-CM | POA: Diagnosis present

## 2018-01-07 DIAGNOSIS — E785 Hyperlipidemia, unspecified: Secondary | ICD-10-CM | POA: Diagnosis present

## 2018-01-07 DIAGNOSIS — M109 Gout, unspecified: Secondary | ICD-10-CM | POA: Diagnosis present

## 2018-01-07 DIAGNOSIS — I10 Essential (primary) hypertension: Secondary | ICD-10-CM | POA: Diagnosis present

## 2018-01-07 DIAGNOSIS — Z720 Tobacco use: Secondary | ICD-10-CM | POA: Diagnosis present

## 2018-01-07 DIAGNOSIS — D649 Anemia, unspecified: Secondary | ICD-10-CM | POA: Diagnosis present

## 2018-01-07 DIAGNOSIS — F17210 Nicotine dependence, cigarettes, uncomplicated: Secondary | ICD-10-CM | POA: Diagnosis present

## 2018-01-07 DIAGNOSIS — E876 Hypokalemia: Secondary | ICD-10-CM

## 2018-01-07 DIAGNOSIS — N022 Recurrent and persistent hematuria with diffuse membranous glomerulonephritis: Secondary | ICD-10-CM | POA: Diagnosis present

## 2018-01-07 LAB — CBC
HEMATOCRIT: 31 % — AB (ref 39.0–52.0)
Hemoglobin: 10 g/dL — ABNORMAL LOW (ref 13.0–17.0)
MCH: 27.2 pg (ref 26.0–34.0)
MCHC: 32.3 g/dL (ref 30.0–36.0)
MCV: 84.2 fL (ref 78.0–100.0)
Platelets: 323 10*3/uL (ref 150–400)
RBC: 3.68 MIL/uL — ABNORMAL LOW (ref 4.22–5.81)
RDW: 15.6 % — AB (ref 11.5–15.5)
WBC: 8.6 10*3/uL (ref 4.0–10.5)

## 2018-01-07 LAB — COMPREHENSIVE METABOLIC PANEL
ALBUMIN: 2.3 g/dL — AB (ref 3.5–5.0)
ALK PHOS: 55 U/L (ref 38–126)
ALT: 13 U/L — ABNORMAL LOW (ref 17–63)
AST: 11 U/L — AB (ref 15–41)
Anion gap: 5 (ref 5–15)
BILIRUBIN TOTAL: 0.4 mg/dL (ref 0.3–1.2)
BUN: 32 mg/dL — AB (ref 6–20)
CALCIUM: 8.4 mg/dL — AB (ref 8.9–10.3)
CO2: 25 mmol/L (ref 22–32)
Chloride: 113 mmol/L — ABNORMAL HIGH (ref 101–111)
Creatinine, Ser: 2.89 mg/dL — ABNORMAL HIGH (ref 0.61–1.24)
GFR calc Af Amer: 26 mL/min — ABNORMAL LOW (ref >60)
GFR calc non Af Amer: 22 mL/min — ABNORMAL LOW (ref >60)
GLUCOSE: 101 mg/dL — AB (ref 65–99)
Potassium: 3.7 mmol/L (ref 3.5–5.1)
Sodium: 143 mmol/L (ref 135–145)
TOTAL PROTEIN: 5.4 g/dL — AB (ref 6.5–8.1)

## 2018-01-07 LAB — MAGNESIUM
MAGNESIUM: 1.9 mg/dL (ref 1.7–2.4)
Magnesium: 1.9 mg/dL (ref 1.7–2.4)

## 2018-01-07 LAB — RAPID URINE DRUG SCREEN, HOSP PERFORMED
Amphetamines: NOT DETECTED
Benzodiazepines: NOT DETECTED
Cocaine: NOT DETECTED
OPIATES: NOT DETECTED
TETRAHYDROCANNABINOL: NOT DETECTED

## 2018-01-07 LAB — BRAIN NATRIURETIC PEPTIDE: B Natriuretic Peptide: 133.2 pg/mL — ABNORMAL HIGH (ref 0.0–100.0)

## 2018-01-07 LAB — PREALBUMIN: PREALBUMIN: 28.3 mg/dL (ref 18–38)

## 2018-01-07 LAB — TSH: TSH: 1.209 u[IU]/mL (ref 0.350–4.500)

## 2018-01-07 LAB — ECHOCARDIOGRAM COMPLETE
Height: 68 in
Weight: 2315.2 oz

## 2018-01-07 LAB — GLUCOSE, CAPILLARY
GLUCOSE-CAPILLARY: 100 mg/dL — AB (ref 65–99)
GLUCOSE-CAPILLARY: 121 mg/dL — AB (ref 65–99)

## 2018-01-07 LAB — PHOSPHORUS: Phosphorus: 3.7 mg/dL (ref 2.5–4.6)

## 2018-01-07 LAB — CREATININE, URINE, RANDOM: Creatinine, Urine: 122.6 mg/dL

## 2018-01-07 LAB — SODIUM, URINE, RANDOM: Sodium, Ur: 41 mmol/L

## 2018-01-07 LAB — HIV ANTIBODY (ROUTINE TESTING W REFLEX): HIV Screen 4th Generation wRfx: NONREACTIVE

## 2018-01-07 MED ORDER — LEVETIRACETAM 500 MG PO TABS
2000.0000 mg | ORAL_TABLET | Freq: Two times a day (BID) | ORAL | Status: DC
  Administered 2018-01-07 – 2018-01-08 (×4): 2000 mg via ORAL
  Filled 2018-01-07 (×4): qty 4

## 2018-01-07 MED ORDER — ACETAMINOPHEN 325 MG PO TABS: 650.0000 mg | ORAL_TABLET | Freq: Four times a day (QID) | ORAL | Status: DC | PRN

## 2018-01-07 MED ORDER — TRAZODONE HCL 50 MG PO TABS: 50.0000 mg | ORAL_TABLET | Freq: Every evening | ORAL | Status: DC | PRN

## 2018-01-07 MED ORDER — ATORVASTATIN CALCIUM 10 MG PO TABS
20.0000 mg | ORAL_TABLET | Freq: Every day | ORAL | Status: DC
  Administered 2018-01-07 – 2018-01-08 (×2): 20 mg via ORAL
  Filled 2018-01-07 (×2): qty 2

## 2018-01-07 MED ORDER — FOLIC ACID 1 MG PO TABS
1.0000 mg | ORAL_TABLET | Freq: Every day | ORAL | Status: DC
  Administered 2018-01-07 – 2018-01-09 (×3): 1 mg via ORAL
  Filled 2018-01-07 (×3): qty 1

## 2018-01-07 MED ORDER — ALLOPURINOL 100 MG PO TABS
100.0000 mg | ORAL_TABLET | Freq: Every day | ORAL | Status: DC
  Administered 2018-01-07 – 2018-01-09 (×3): 100 mg via ORAL
  Filled 2018-01-07 (×3): qty 1

## 2018-01-07 MED ORDER — LAMOTRIGINE 25 MG PO TABS
150.0000 mg | ORAL_TABLET | Freq: Two times a day (BID) | ORAL | Status: DC
  Administered 2018-01-07 (×2): 150 mg via ORAL
  Filled 2018-01-07: qty 1
  Filled 2018-01-07: qty 2

## 2018-01-07 MED ORDER — AMLODIPINE BESYLATE 5 MG PO TABS
5.0000 mg | ORAL_TABLET | Freq: Every day | ORAL | Status: DC
  Administered 2018-01-07 – 2018-01-08 (×2): 5 mg via ORAL
  Filled 2018-01-07 (×2): qty 1

## 2018-01-07 MED ORDER — HYDRALAZINE HCL 20 MG/ML IJ SOLN
10.0000 mg | INTRAMUSCULAR | Status: DC | PRN
  Administered 2018-01-08: 10 mg via INTRAVENOUS
  Filled 2018-01-07: qty 1

## 2018-01-07 MED ORDER — CITALOPRAM HYDROBROMIDE 20 MG PO TABS
40.0000 mg | ORAL_TABLET | Freq: Every day | ORAL | Status: DC
  Administered 2018-01-08 – 2018-01-09 (×2): 40 mg via ORAL
  Filled 2018-01-07 (×2): qty 2

## 2018-01-07 MED ORDER — CITALOPRAM HYDROBROMIDE 20 MG PO TABS
20.0000 mg | ORAL_TABLET | Freq: Once | ORAL | Status: AC
  Administered 2018-01-07: 20 mg via ORAL
  Filled 2018-01-07: qty 1

## 2018-01-07 MED ORDER — MAGNESIUM SULFATE 2 GM/50ML IV SOLN
2.0000 g | Freq: Once | INTRAVENOUS | Status: AC
  Administered 2018-01-07: 2 g via INTRAVENOUS
  Filled 2018-01-07: qty 50

## 2018-01-07 MED ORDER — ONDANSETRON HCL 4 MG PO TABS: 4.0000 mg | ORAL_TABLET | Freq: Four times a day (QID) | ORAL | Status: DC | PRN

## 2018-01-07 MED ORDER — HYDRALAZINE HCL 50 MG PO TABS
50.0000 mg | ORAL_TABLET | Freq: Three times a day (TID) | ORAL | Status: DC
  Administered 2018-01-07 – 2018-01-08 (×4): 50 mg via ORAL
  Filled 2018-01-07 (×4): qty 1

## 2018-01-07 MED ORDER — METOPROLOL TARTRATE 50 MG PO TABS
50.0000 mg | ORAL_TABLET | Freq: Two times a day (BID) | ORAL | Status: DC
  Administered 2018-01-07 – 2018-01-09 (×6): 50 mg via ORAL
  Filled 2018-01-07 (×6): qty 1

## 2018-01-07 MED ORDER — ACETAMINOPHEN 650 MG RE SUPP: 650.0000 mg | Freq: Four times a day (QID) | RECTAL | Status: DC | PRN

## 2018-01-07 MED ORDER — LAMOTRIGINE 100 MG PO TABS
100.0000 mg | ORAL_TABLET | Freq: Two times a day (BID) | ORAL | Status: DC
  Administered 2018-01-07 – 2018-01-09 (×4): 100 mg via ORAL
  Filled 2018-01-07 (×4): qty 1

## 2018-01-07 MED ORDER — ENOXAPARIN SODIUM 30 MG/0.3ML ~~LOC~~ SOLN
30.0000 mg | Freq: Every day | SUBCUTANEOUS | Status: DC
  Administered 2018-01-07 – 2018-01-09 (×3): 30 mg via SUBCUTANEOUS
  Filled 2018-01-07 (×3): qty 0.3

## 2018-01-07 MED ORDER — ONDANSETRON HCL 4 MG/2ML IJ SOLN
4.0000 mg | Freq: Four times a day (QID) | INTRAMUSCULAR | Status: DC | PRN
  Administered 2018-01-07: 4 mg via INTRAVENOUS
  Filled 2018-01-07: qty 2

## 2018-01-07 MED ORDER — ADULT MULTIVITAMIN W/MINERALS CH
1.0000 | ORAL_TABLET | Freq: Every day | ORAL | Status: DC
  Administered 2018-01-07 – 2018-01-09 (×3): 1 via ORAL
  Filled 2018-01-07 (×3): qty 1

## 2018-01-07 MED ORDER — POTASSIUM PHOSPHATES 15 MMOLE/5ML IV SOLN: 40.0000 meq | Freq: Once | INTRAVENOUS | Status: DC

## 2018-01-07 MED ORDER — HYDROCODONE-ACETAMINOPHEN 5-325 MG PO TABS: 1.0000 | ORAL_TABLET | ORAL | Status: DC | PRN

## 2018-01-07 MED ORDER — CITALOPRAM HYDROBROMIDE 20 MG PO TABS
20.0000 mg | ORAL_TABLET | Freq: Every day | ORAL | Status: DC
  Administered 2018-01-07: 20 mg via ORAL
  Filled 2018-01-07: qty 1

## 2018-01-07 NOTE — ED Notes (Signed)
ED TO INPATIENT HANDOFF REPORT  Name/Age/Gender Jonathan Pierce 62 y.o. male  Code Status Code Status History    Date Active Date Inactive Code Status Order ID Comments User Context   03/03/2015 2357 03/04/2015 1952 Full Code [id number]  Serita Grit, MD ED   12/31/2014 0047 01/02/2015 1831 Full Code [id number]  Laverle Hobby, PA-C Inpatient   12/30/2014 1551 12/31/2014 0047 Full Code [id number]  Mitzi Hansen ED      Home/SNF/Other Home  Chief Complaint kidney pain   Level of Care/Admitting Diagnosis ED Disposition    ED Disposition Condition [facility] Area: [facility] [100102]  Level of Care: Telemetry [5]  Admit to tele based on following criteria: Other see comments  Comments: aki  Diagnosis: AKI (acute kidney injury) [facility]) [[id number]]  Admitting Physician: Toy Baker [[id number]]  Attending Physician: Toy Baker [[id number]]  Estimated length of stay: 3 - 4 days  Certification:: I certify this patient will need inpatient services for at least 2 midnights  PT Class (Do Not Modify): Inpatient [101]  PT Acc Code (Do Not Modify): Private [1]       Medical History Past Medical History:  Diagnosis Date  . Depression   . Seizures ([place])     Allergies No Known Allergies  IV Location/Drains/Wounds Patient Lines/Drains/Airways Status   Active Line/Drains/Airways    Name:   Placement date:   Placement time:   Site:   Days:   Peripheral IV 01/06/18 Right;Anterior Forearm   01/06/18    2347    Forearm   1          Labs/Imaging Results for orders placed or performed during the hospital encounter of 01/06/18 (from the past 48 hour(s))  Lipase, blood     Status: None   Collection Time: 01/06/18  9:36 PM  Result Value Ref Range   Lipase 36 11 - 51 U/L    Comment: Performed at [facility], [place] [address] [place], [place] [address]  Comprehensive metabolic panel     Status: Abnormal   Collection Time: 01/06/18   9:36 PM  Result Value Ref Range   Sodium 143 135 - 145 mmol/L   Potassium 3.5 3.5 - 5.1 mmol/L   Chloride 114 (H) 101 - 111 mmol/L   CO2 23 22 - 32 mmol/L   Glucose, Bld 121 (H) 65 - 99 mg/dL   BUN 29 (H) 6 - 20 mg/dL   Creatinine, Ser 2.82 (H) 0.61 - 1.24 mg/dL   Calcium 8.6 (L) 8.9 - 10.3 mg/dL   Total Protein 6.3 (L) 6.5 - 8.1 g/dL   Albumin 2.5 (L) 3.5 - 5.0 g/dL   AST 12 (L) 15 - 41 U/L   ALT 16 (L) 17 - 63 U/L   Alkaline Phosphatase 62 38 - 126 U/L   Total Bilirubin 0.3 0.3 - 1.2 mg/dL   GFR calc non Af Amer 23 (L) >60 mL/min   GFR calc Af Amer 26 (L) >60 mL/min    Comment: (NOTE) The eGFR has been calculated using the CKD EPI equation. This calculation has not been validated in all clinical situations. eGFR's persistently <60 mL/min signify possible Chronic Kidney Disease.    Anion gap 6 5 - 15    Comment: Performed at [facility], [place] [address], [place], [place] [address]  CBC     Status: Abnormal   Collection Time: 01/06/18  9:36 PM  Result Value Ref Range  WBC 11.3 (H) 4.0 - 10.5 K/uL   RBC 4.07 (L) 4.22 - 5.81 MIL/uL   Hemoglobin 11.2 (L) 13.0 - 17.0 g/dL   HCT 34.4 (L) 39.0 - 52.0 %   MCV 84.5 78.0 - 100.0 fL   MCH 27.5 26.0 - 34.0 pg   MCHC 32.6 30.0 - 36.0 g/dL   RDW 15.4 11.5 - 15.5 %   Platelets 350 150 - 400 K/uL    Comment: Performed at [facility], [address], [place], [place] [address]  Magnesium     Status: None   Collection Time: 01/06/18  9:36 PM  Result Value Ref Range   Magnesium 1.9 1.7 - 2.4 mg/dL    Comment: Performed at [facility], [place] [address] [place], [place] [address]  Urinalysis, Routine w reflex microscopic     Status: Abnormal   Collection Time: 01/06/18 11:41 PM  Result Value Ref Range   Color, Urine YELLOW YELLOW   APPearance CLEAR CLEAR   Specific Gravity, Urine 1.014 1.005 - 1.030   pH 6.0 5.0 - 8.0   Glucose, UA 150 (A) NEGATIVE mg/dL   Hgb urine dipstick  NEGATIVE NEGATIVE   Bilirubin Urine NEGATIVE NEGATIVE   Ketones, ur NEGATIVE NEGATIVE mg/dL   Protein, ur >=300 (A) NEGATIVE mg/dL   Nitrite NEGATIVE NEGATIVE   Leukocytes, UA NEGATIVE NEGATIVE   RBC / HPF 6-10 0 - 5 RBC/hpf   WBC, UA 0-5 0 - 5 WBC/hpf   Bacteria, UA NONE SEEN NONE SEEN   Squamous Epithelial / LPF 0-5 0 - 5    Comment: Performed at [facility], [place] [address] [place], [place] [address]   No results found.  Pending Labs [employer] (From admission, onward)   Start     Ordered   01/07/18 0500  Prealbumin  Tomorrow morning,   R     01/07/18 0127   Signed and Held  Sodium, urine, random  Once,   R     Signed and Held   Signed and Held  Creatinine, urine, random  Once,   R     Signed and Held   Signed and Held  Urine rapid drug screen (hosp performed)  STAT,   R     Signed and Held   Signed and Held  HIV antibody (Routine Testing)  Tomorrow morning,   R     Signed and Held   Signed and Held  Magnesium  Tomorrow morning,   R    Comments:  Call MD if <1.5    Signed and Held   Signed and Held  Phosphorus  Tomorrow morning,   R     Signed and Held   Signed and Held  TSH  Once,   R    Comments:  Cancel if already done within 1 month and notify MD    Signed and Held   Signed and Held  Comprehensive metabolic panel  Once,   R    Comments:  Cal MD for K<3.5 or >5.0    Signed and Held   Signed and Held  CBC  Once,   R    Comments:  Call for hg <8.0    Signed and Held      Vitals/Pain Today's Vitals   01/07/18 0030 01/07/18 0052 01/07/18 0100 01/07/18 0130  BP: (!) 176/80 (!) 176/80 (!) 173/75 (!) 146/65  Pulse: 74 64 65 60  Resp:  16    Temp:  TempSrc:      SpO2: 95% 99% 96% 98%  PainSc:        Isolation Precautions No active isolations  Medications Medications  hydrALAZINE (APRESOLINE) injection 20 mg (20 mg Intravenous Given 01/06/18 2348)    Mobility walks

## 2018-01-07 NOTE — H&P (Signed)
Jonathan Pierce ZOX:[id number] DOB: May 13, 1956 DOA: 01/06/2018     [facility]    Outpatient Specialists:     NEphrology:  Dr. Earna Coder   Patient arrived to ER on 01/06/18 at 2048  Patient coming from:  home Lives  With family    Chief Complaint:  Chief Complaint  Patient presents with  . Sent By Nephrology  . Hypertension    HPI: Jonathan Pierce is a 62 y.o. male with medical history significant of     nephrotic syndrome depression, seizures hypertension gout lipidemia   Presented with abnormal lab work done today by [place] showed worsening kidney function at baseline creatinine around 1 today was up to 2.8 he was told to present to emergency department otherwise he has not had any fevers or chills no chest pain or shortness of breath no leg swelling he does endorse mild headache.  Reports have been taking his home medications. Reports today BP have been  Up to 200 this is not usual. Patient had his son getting married today so it was a stressful day. He reports he has not been drinking today at all.   At baseline BP runs at 140-150 systolic.   Deneis any chest pain  Last seizure has been over past 2 years ago  Regarding pertinent Chronic problems: Nephrotic syndrome followed by nephrology had extensive work-up done serologic evaluation was negative for: ANA, dsDNA, SSA, SSb, RA, MPO< Hep C, HgbA1C = 5.5%.  Gene showed no evidence of polycystic disease  biopsy showed membraneous changes  Felt to be due to poor BP control   While in ER: Noted to be severely hypertensive with blood pressure up to 201/94 Creatinine noted to be 2 up from baseline of 0.8 with albumin 2.5  Following Medications were ordered in ER: Medications  hydrALAZINE (APRESOLINE) injection 20 mg (20 mg Intravenous Given 01/06/18 2348)    Significant initial  Findings: Abnormal Labs Reviewed  COMPREHENSIVE METABOLIC PANEL - Abnormal; Notable for the following  components:      Result Value   Chloride 114 (*)    Glucose, Bld 121 (*)    BUN 29 (*)    Creatinine, Ser 2.82 (*)    Calcium 8.6 (*)    Total Protein 6.3 (*)    Albumin 2.5 (*)    AST 12 (*)    ALT 16 (*)    GFR calc non Af Amer 23 (*)    GFR calc Af Amer 26 (*)    All other components within normal limits  CBC - Abnormal; Notable for the following components:   WBC 11.3 (*)    RBC 4.07 (*)    Hemoglobin 11.2 (*)    HCT 34.4 (*)    All other components within normal limits  URINALYSIS, ROUTINE W REFLEX MICROSCOPIC - Abnormal; Notable for the following components:   Glucose, UA 150 (*)    Protein, ur >=300 (*)    All other components within normal limits     Na 143 K 3.5  Cr   Up from baseline see below Lab Results  Component Value Date   CREATININE 2.82 (H) 01/06/2018   CREATININE 0.80 03/03/2015   CREATININE 0.76 12/30/2014      WBC 11.3  HG/HCT   Stable     Component Value Date/Time   HGB 11.2 (L) 01/06/2018 2136   HCT 34.4 (L) 01/06/2018 2136  UA no evidence of UTI     ECG:  Not ordered    ED Triage Vitals [01/06/18 2129]  Enc Vitals Group     BP (!) 173/83     Pulse Rate 60     Resp 14     Temp 98.2 F (36.8 C)     Temp Source Oral     SpO2 100 %     Weight      Height      Head Circumference      Peak Flow      Pain Score 0     Pain Loc      Pain Edu?      Excl. in GC?   ZOXW(96)@       Latest  Blood pressure (!) 176/90, pulse 63, temperature 98.1 F (36.7 C), temperature source Oral, resp. rate 17, SpO2 98 %.    Hospitalist was called for admission for  AKI setting of history of hepatic syndrome and poorly controlled hypertension   Review of Systems:    Pertinent positives include:   weight loss  loss of appetite,  Constitutional:  No weight loss, night sweats, Fevers, chills, fatigue, HEENT:  No headaches, Difficulty swallowing,Tooth/dental problems,Sore throat,  No sneezing, itching, ear ache, nasal congestion,  post nasal drip,  Cardio-vascular:  No chest pain, Orthopnea, PND, anasarca, dizziness, palpitations.no Bilateral lower extremity swelling  GI:  No heartburn, indigestion, abdominal pain, nausea, vomiting, diarrhea, change in bowel habits, melena, blood in stool, hematemesis Resp:  no shortness of breath at rest. No dyspnea on exertion, No excess mucus, no productive cough, No non-productive cough, No coughing up of blood.No change in color of mucus.No wheezing. Skin:  no rash or lesions. No jaundice GU:  no dysuria, change in color of urine, no urgency or frequency. No straining to urinate.  No flank pain.  Musculoskeletal:  No joint pain or no joint swelling. No decreased range of motion. No back pain.  Psych:  No change in mood or affect. No depression or anxiety. No memory loss.  Neuro: no localizing neurological complaints, no tingling, no weakness, no double vision, no gait abnormality, no slurred speech, no confusion  As per HPI otherwise 10 point review of systems negative.   Past Medical History:   Past Medical History:  Diagnosis Date  . Depression   . Seizures (HCC)       History reviewed. No pertinent surgical history.  Social History:  Ambulatory  independently      reports that he has been smoking cigarettes.  He has been smoking about 1.00 pack per day. He does not have any smokeless tobacco history on file. He reports that he drinks alcohol. He reports that he does not use drugs.     Family History:   Family History  Problem Relation Age of Onset  . Diabetes Sister   . CAD Neg Hx   . Cancer Neg Hx   . Stroke Neg Hx   . Hypertension Neg Hx   . Renal Disease Neg Hx     Allergies: No Known Allergies   Prior to Admission medications   Medication Sig Start Date End Date Taking? Authorizing Provider  citalopram (CELEXA) 20 MG tablet Take 1 tablet (20 mg total) by mouth daily. Patient not taking: Reported on 03/04/2015 01/02/15   Adonis Brook, NP    citalopram (CELEXA) 40 MG tablet Take 20 mg by mouth daily.    [provider]  hydrOXYzine (ATARAX/VISTARIL) 25 MG  tablet Take 1 tablet (25 mg total) by mouth every 6 (six) hours as needed for anxiety. 01/02/15   Adonis Brook, NP  lamoTRIgine (LAMICTAL) 100 MG tablet Take 100 mg by mouth 2 (two) times daily.    [provider]  levETIRAcetam (KEPPRA) 1000 MG tablet Take 2 tablets (2,000 mg total) by mouth 2 (two) times daily. Patient not taking: Reported on 03/03/2015 01/02/15   Adonis Brook, NP  levETIRAcetam (KEPPRA) 500 MG tablet Take 2,000 mg by mouth 2 (two) times daily.    [provider]  traZODone (DESYREL) 50 MG tablet Take 1 tablet (50 mg total) by mouth at bedtime and may repeat dose one time if needed. Patient taking differently: Take 50 mg by mouth at bedtime as needed for sleep. sleep 01/02/15   Adonis Brook, NP  Vitamin D, Ergocalciferol, (DRISDOL) 50000 UNITS CAPS capsule Take 50,000 Units by mouth every 7 (seven) days.    [provider]   Physical Exam: Blood pressure (!) 176/90, pulse 63, temperature 98.1 F (36.7 C), temperature source Oral, resp. rate 17, SpO2 98 %. 1. General:  in No Acute distress  well  -appearing 2. Psychological: Alert and   Oriented 3. Head/ENT:     Dry Mucous Membranes                          Head Non traumatic, neck supple, JVD noted                           Poor Dentition 4. SKIN:  decreased Skin turgor,  Skin clean Dry and intact no rash 5. Heart: Regular rate and rhythm no  Murmur, no Rub or gallop 6. Lungs:  Clear to auscultation bilaterally, no wheezes or crackles   7. Abdomen: Soft, non-tender, Non distended   bowel sounds present 8. Lower extremities: no clubbing, cyanosis, 1+ edema 9. Neurologically Grossly intact, moving all 4 extremities equally  10. MSK: Normal range of motion   LABS:     Recent Labs  Lab 01/06/18 2136  WBC 11.3*  HGB 11.2*  HCT 34.4*  MCV 84.5  PLT 350   Basic  Metabolic Panel: Recent Labs  Lab 01/06/18 2136  NA 143  K 3.5  CL 114*  CO2 23  GLUCOSE 121*  BUN 29*  CREATININE 2.82*  CALCIUM 8.6*      Recent Labs  Lab 01/06/18 2136  AST 12*  ALT 16*  ALKPHOS 62  BILITOT 0.3  PROT 6.3*  ALBUMIN 2.5*   Recent Labs  Lab 01/06/18 2136  LIPASE 36   No results for input(s): AMMONIA in the last 168 hours.    HbA1C: No results for input(s): HGBA1C in the last 72 hours. CBG: No results for input(s): GLUCAP in the last 168 hours.    Urine analysis:    Component Value Date/Time   COLORURINE YELLOW 01/06/2018 2341   APPEARANCEUR CLEAR 01/06/2018 2341   LABSPEC 1.014 01/06/2018 2341   PHURINE 6.0 01/06/2018 2341   GLUCOSEU 150 (A) 01/06/2018 2341   HGBUR NEGATIVE 01/06/2018 2341   BILIRUBINUR NEGATIVE 01/06/2018 2341   KETONESUR NEGATIVE 01/06/2018 2341   PROTEINUR >=300 (A) 01/06/2018 2341   NITRITE NEGATIVE 01/06/2018 2341   LEUKOCYTESUR NEGATIVE 01/06/2018 2341      Cultures: No results found for: SDES, SPECREQUEST, CULT, REPTSTATUS   Radiological Exams on Admission: No results found.  Chart has been reviewed  Assessment/Plan  62 y.o. male with medical history significant of     nephrotic syndrome depression, seizures hypertension gout lipidemia   Admitted for AKI in the setting of nephrotic syndrome and poorly controlled hypertension  Present on Admission: AKI -  unclear timing of its occurrence patient have not had any blood work done for the past 1 year.  He appears to have bilateral leg edema and JVD but also have had some decreased p.o. intake and low albumin.  Fluid status difficult to ascertain given elevated blood pressure and JVD we will hold off on IV fluids for now. HLD ARB and Lasix for tonight Defer to nephrology if he would benefit from albumin.  Monitor kidney function check renal ultrasound in a.m. obtain urine electrolytes would benefit from nephrology consult in a.m. Marland Kitchen. Hypokalemia  - replace  check magnesium level Seizure disorder stable continue home medications . Major depressive disorder, recurrent, severe without psychotic features (HCC) continue home medications . Accelerated hypertension -last year note states the patient has been on Lasix 40 mg a day losartan 100 mg a day Lopressor 50 mg twice a day and hydralazine resume home medications exception for ARB and lasix PRN hydralazine check U tox. Per family today was an isolated event Patient used to be on Lipitor no longer on his medication list  Other plan as per orders.  DVT prophylaxis:  lovenox    Code Status:  FULL CODE as per patient   I had personally discussed CODE STATUS with patient    Family Communication:   Family   at  Bedside  plan of care was discussed with  Wife,   Disposition Plan:    To home once workup is complete and patient is stable                            Consults called: none  Admission status:   inpatient      Level of care     tele                Therisa Doynenastassia Khamani Daniely 01/07/2018, 1:42 AM    [facility]  Pager [phone]   after 2 AM please page floor coverage PA If 7AM-7PM, please contact the day team taking care of the patient  [email or web address]  Password [email or web address]

## 2018-01-07 NOTE — Progress Notes (Signed)
  Echocardiogram 2D Echocardiogram has been performed.  Leta JunglingCooper, Tambra Muller M 01/07/2018, 9:44 AM

## 2018-01-07 NOTE — Consult Note (Signed)
Jonathan Pierce Admit Date: 01/06/2018 01/07/2018 Jonathan Pierce Requesting Physician:  Jonathan Pierce  Reason for Consult:  AKI, hx/o nephrotic syndrome HPI:  62 year old male seen for evaluation of acute renal failure with history of nephrotic proteinuria.  Patient receives much of his care at the [facility] in [place].  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  His blood pressure was noted to be elevated, patient says systolic greater than 200.  He was called after the evaluation because of abnormal blood work and told to present to the emergency room.  He stated he had been having some headaches.  Emergency department labs confirmed renal failure with creatinine of 2.82, potassium 3.5, bicarbonate 23, BUN 29.  Prior to yesterday he was unaware of any worrisome blood work related to his kidneys.  Renal ultrasound revealed 13 cm right kidney with mild right-sided hydronephrosis and increased echogenicity, there was a left 13.7 cm with increased echogenicity but no hydronephrosis.  The bladder was reported as being thickened.  Urine analysis with 4+ protein, 6-10 erythrocytes per high-powered field, no leukocytes.  Patient denies use of nonsteroidals preceding admission.  No reported antibiotics. no nausea, vomiting, diarrhea.  No recent contrasted imaging procedures.  He has chronic lower extremity edema, stating that it is better than it was yesterday.  He states that his headache is improved today.  Home ARB and Lasix held.     Creatinine, Ser (mg/dL)  Date Value  81/19/1478 2.89 (H)  01/06/2018 2.82 (H)  03/03/2015 0.80  12/30/2014 0.76  ] I/Os:  ROS NSAIDS:  Denies use IV Contrast no exposure TMP/SMX no exposure Hypotension no exposure Balance of 12 systems is negative w/ exceptions as above  PMH  Past Medical History:  Diagnosis Date  . Depression   . Seizures (HCC)    PSH History reviewed. No pertinent surgical history. FH  Family History  Problem Relation Age of Onset  . Diabetes Sister   . CAD Neg Hx   . Cancer Neg Hx   . Stroke Neg Hx   . Hypertension Neg Hx   . Renal Disease Neg Hx    SH  reports that he has been smoking cigarettes.  He has been smoking about 1.00 pack per day. He has never used smokeless tobacco. He reports that he drinks alcohol. He reports that he does not use drugs. Allergies No Known Allergies Home medications Prior to Admission medications   Medication Sig Start Date End Date Taking? Authorizing Provider  allopurinol (ZYLOPRIM) 100 MG tablet Take 100 mg by mouth daily.   Yes Provider, Historical, Pierce  atorvastatin (LIPITOR) 20 MG tablet Take 20 mg by mouth daily at 6 PM.   Yes Provider, Historical, Pierce  citalopram (CELEXA) 40 MG tablet Take 40 mg by mouth daily.    Yes Provider, Historical, Pierce  furosemide (LASIX) 40 MG tablet Take 40 mg by mouth daily.   Yes Provider, Historical, Pierce  hydrALAZINE (APRESOLINE) 50 MG tablet Take 50 mg by mouth 3 (three) times daily.   Yes Provider, Historical, Pierce  hydrOXYzine (ATARAX/VISTARIL) 25 MG tablet Take 1 tablet (25 mg total) by mouth every 6 (six) hours as needed for anxiety. 01/02/15  Yes Agustin,  Velna Hatchet, NP  lamoTRIgine (LAMICTAL) 100 MG tablet Take 100 mg by mouth 2 (two) times daily.    Yes Provider, Historical, Pierce  levETIRAcetam (KEPPRA) 1000 MG tablet Take 2 tablets (2,000 mg total) by mouth 2 (two) times daily. 01/02/15  Yes Adonis Brook, NP  losartan (COZAAR) 100 MG tablet Take 100 mg by mouth daily.   Yes Provider, Historical, Pierce  metoprolol tartrate (LOPRESSOR) 50 MG tablet Take 50 mg by mouth 2 (two) times daily.   Yes Provider, Historical, Pierce  traZODone  (DESYREL) 50 MG tablet Take 1 tablet (50 mg total) by mouth at bedtime and may repeat dose one time if needed. Patient taking differently: Take 50 mg by mouth at bedtime as needed for sleep. sleep 01/02/15  Yes Adonis Brook, NP  Vitamin D, Ergocalciferol, (DRISDOL) 50000 UNITS CAPS capsule Take 50,000 Units by mouth every 7 (seven) days.   Yes Provider, Historical, Pierce  citalopram (CELEXA) 20 MG tablet Take 1 tablet (20 mg total) by mouth daily. Patient not taking: Reported on 03/04/2015 01/02/15   Adonis Brook, NP    Current Medications Scheduled Meds: . allopurinol  100 mg Oral Daily  . atorvastatin  20 mg Oral q1800  . citalopram  20 mg Oral Once  . [START ON 01/08/2018] citalopram  40 mg Oral Daily  . enoxaparin (LOVENOX) injection  30 mg Subcutaneous Daily  . folic acid  1 mg Oral Daily  . hydrALAZINE  50 mg Oral TID  . lamoTRIgine  100 mg Oral BID  . levETIRAcetam  2,000 mg Oral BID  . metoprolol tartrate  50 mg Oral BID  . multivitamin with minerals  1 tablet Oral Daily   Continuous Infusions: PRN Meds:.acetaminophen **OR** acetaminophen, hydrALAZINE, HYDROcodone-acetaminophen, ondansetron **OR** ondansetron (ZOFRAN) IV, traZODone  CBC Recent Labs  Lab 01/06/18 2136 01/07/18 0553  WBC 11.3* 8.6  HGB 11.2* 10.0*  HCT 34.4* 31.0*  MCV 84.5 84.2  PLT 350 323   Basic Metabolic Panel Recent Labs  Lab 01/06/18 2136 01/07/18 0553  NA 143 143  K 3.5 3.7  CL 114* 113*  CO2 23 25  GLUCOSE 121* 101*  BUN 29* 32*  CREATININE 2.82* 2.89*  CALCIUM 8.6* 8.4*  PHOS  --  3.7    Physical Exam  Blood pressure (!) 163/80, pulse (!) 53, temperature 97.7 F (36.5 C), temperature source Oral, resp. rate 13, height 5\' 8"  (1.727 m), weight 65.6 kg (144 lb 11.2 oz), SpO2 100 %. GEN: No acute distress, lying in bed ENT: Poor dentition, NCAT EYES: EOMI CV: RRR, normal S1-S2 PULM: Clear bilaterally, normal work of breathing, no crackles ABD: Soft, nontender SKIN: No rashes or  lesions EXT: 2+ pitting edema to the mid shins   Assessment 62 year old male presenting with severe, uncontrolled hypertension and renal failure with a history of membranous nephropathy on full dose losartan.  Very difficult to say if this is entirely acute or he has had progression of disease; it does not seem that he follows very closely with his physicians.  Further, his normal-sized kidneys did mention some mild hydronephrosis on the right side with some bladder wall thickening.   1. Renal failure, unclear time course of acute/subacute  2. Membranous Nephropathy on ARB Bx 02/2017 at [facility]; hypoalbuminemia 3. HTN, longstanding and uncontrolled 4. ? R sided HN and thickiening of bladder wall on renal US  Plan 1. Agree with holding ARB at this time 2. Would hold diuretic for another 24 hours 3. UP/C 4. Would be  helpful to know if any more recent renal function testing performed 5. Discussed renal ultrasound with urology; it appears relatively minor/benign at this time; if symptoms worsen or renal failure worsens follow-up with CT of the abdomen for further evaluation 6. Cont MTP; add amlodipine for BP control 7. Daily weights, Daily Renal Panel, Strict I/Os, Avoid nephrotoxins (NSAIDs, judicious IV Contrast)  8. Will continue to follow   Sabra Heckyan Sanford Pierce [phone] pgr 01/07/2018, 2:18 PM

## 2018-01-07 NOTE — Progress Notes (Signed)
[name] is a 62 year old male with past medical history significant for membranous nephropathy, hypertension, seizure disorder, chronic depression, gout, hyperlipidemia who presented to the ED as [facility] by [facility] in [place] due to AKI and persistent proteinuria. Upon presentation to the ED patient was found to be in hypertensive emergency with AKI and significant elevation in his creatinine.  UA positive for proteinuria.  01/07/2018: Patient seen and examined at his bedside.  Nephrology called for consult.  Highly appreciated.  Losartan has been held due to AKI.  Bilateral lower extremity edema on exam.  Fluid held to avoid worsening of third spacing due to hypoalbuminemia.  Please refer to dictation H&P done by Dr. Adela Glimpseoutova on 01/07/2018 for further details of the assessment and plan. .Marland Kitchen

## 2018-01-08 LAB — COMPREHENSIVE METABOLIC PANEL
ALBUMIN: 2.2 g/dL — AB (ref 3.5–5.0)
ALT: 12 U/L — ABNORMAL LOW (ref 17–63)
ANION GAP: 7 (ref 5–15)
AST: 9 U/L — ABNORMAL LOW (ref 15–41)
Alkaline Phosphatase: 52 U/L (ref 38–126)
BUN: 29 mg/dL — ABNORMAL HIGH (ref 6–20)
CO2: 23 mmol/L (ref 22–32)
Calcium: 8.4 mg/dL — ABNORMAL LOW (ref 8.9–10.3)
Chloride: 112 mmol/L — ABNORMAL HIGH (ref 101–111)
Creatinine, Ser: 2.72 mg/dL — ABNORMAL HIGH (ref 0.61–1.24)
GFR calc Af Amer: 28 mL/min — ABNORMAL LOW (ref >60)
GFR calc non Af Amer: 24 mL/min — ABNORMAL LOW (ref >60)
Glucose, Bld: 97 mg/dL (ref 65–99)
POTASSIUM: 3.7 mmol/L (ref 3.5–5.1)
SODIUM: 142 mmol/L (ref 135–145)
Total Bilirubin: 0.4 mg/dL (ref 0.3–1.2)
Total Protein: 5.4 g/dL — ABNORMAL LOW (ref 6.5–8.1)

## 2018-01-08 LAB — PROTEIN / CREATININE RATIO, URINE
Creatinine, Urine: 98.75 mg/dL
PROTEIN CREATININE RATIO: 6.24 mg/mg{creat} — AB (ref 0.00–0.15)
TOTAL PROTEIN, URINE: 616 mg/dL

## 2018-01-08 LAB — MAGNESIUM: Magnesium: 2.2 mg/dL (ref 1.7–2.4)

## 2018-01-08 MED ORDER — FUROSEMIDE 40 MG PO TABS
160.0000 mg | ORAL_TABLET | Freq: Two times a day (BID) | ORAL | Status: DC
Start: 2018-01-08 — End: 2018-01-09
  Administered 2018-01-08 – 2018-01-09 (×2): 160 mg via ORAL
  Filled 2018-01-08 (×2): qty 4

## 2018-01-08 MED ORDER — LEVETIRACETAM 500 MG PO TABS
500.0000 mg | ORAL_TABLET | Freq: Two times a day (BID) | ORAL | Status: DC
  Administered 2018-01-08 – 2018-01-09 (×2): 500 mg via ORAL
  Filled 2018-01-08 (×2): qty 1

## 2018-01-08 MED ORDER — HYDRALAZINE HCL 50 MG PO TABS
50.0000 mg | ORAL_TABLET | Freq: Two times a day (BID) | ORAL | Status: DC
  Administered 2018-01-08 – 2018-01-09 (×2): 50 mg via ORAL
  Filled 2018-01-08 (×2): qty 1

## 2018-01-08 MED ORDER — AMLODIPINE BESYLATE 10 MG PO TABS
10.0000 mg | ORAL_TABLET | Freq: Every day | ORAL | Status: DC
  Administered 2018-01-09: 10 mg via ORAL
  Filled 2018-01-08: qty 1

## 2018-01-08 NOTE — Progress Notes (Signed)
PROGRESS NOTE  Jonathan HoltsDavid Karren XBJ:[id number] DOB: May 19, 1956 DOA: 01/06/2018 PCP: Patient, No Pcp Per  HPI/Recap of past 24 hours: Jonathan Pierce is a 62 year old male with past medical history significant for membranous nephropathy, hypertension, seizure disorder, chronic depression, gout, hyperlipidemia who presented to the ED as recommended by his [facility] in [place] due to AKI and persistent proteinuria. Upon presentation to the ED patient was found to be in hypertensive emergency with AKI and significant elevation in his creatinine.  UA positive for proteinuria.  01/07/2018: Patient seen and examined at his bedside.  Nephrology called for consult.  Highly appreciated.  Losartan has been held due to AKI.  Bilateral lower extremity edema on exam.  Fluid held to avoid worsening of third spacing due to hypoalbuminemia.  01/08/2018: Seen and examined at his bedside.  He has no new complaints.  He denies chest pain, dyspnea, palpitations, dysuria, or hematuria.    Assessment/Plan: Active Problems:   Major depressive disorder, recurrent, severe without psychotic features (HCC)   Hypokalemia   Seizure disorder (HCC)   Nephrotic syndrome   Accelerated hypertension   Tobacco abuse   AKI (acute kidney injury) (HCC)  AKI in the setting of recurrent proteinuria/nephrotic syndrome [facility] Creatinine on presentation 2.8 from baseline of 0.8, 2 years ago Creatinine is trending down to 2.72 Avoid nephrotoxic agents/hypotension versus dehydration Monitor urine output Continue to hold HCTZ and losartan Nephrology consulted and highly appreciated Renal ultrasound revealed mild right-sided hydronephrosis and bladder wall thickening Repeat BMP in the morning  History of nephrotic syndrome with recurrent proteinuria UA on admission significant for proteinuria greater than 300 Previously on losartan at home which is been held due to  AKI Nephrology following  Hypertensive emergency Blood pressure on presentation systolic greater than 200 with AKI Blood pressure is now improving Increase amlodipine to 10 mg daily Lasix p.o. 160 twice daily per nephrology Hydralazine p.o. 50 mg twice daily Monitor urine output  Seizure disorder, stable Continue Keppra Dose of Keppra reduced to 500 mg twice daily due to AKI Continue Lamictal  History of gout, stable Not in exacerbation Continue allopurinol 100 mg daily  Chronic anxiety, stable Continue Celexa 40 mg daily  Hyperlipidemia Continue Lipitor 20 mg daily     Code Status: Full code  Family Communication: None at bedside  Disposition Plan: Home when clinically stable   Consultants:  Nephrology  Procedures:  none  Antimicrobials: none  DVT prophylaxis: Subcu Lovenox   Objective: Vitals:   01/07/18 1700 01/07/18 2020 01/07/18 2046 01/08/18 0555  BP: (!) 173/76 (!) 162/65 (!) 178/67 (!) 169/77  Pulse: (!) 50 (!) 50 (!) 50 (!) 48  Resp: 13 16  17   Temp: 98 F (36.7 C) 98.2 F (36.8 C)  (!) 97.3 F (36.3 C)  TempSrc: Oral Oral  Oral  SpO2: 99% 99%  99%  Weight:      Height:        Intake/Output Summary (Last 24 hours) at 01/08/2018 1211 Last data filed at 01/07/2018 2000 Gross per 24 hour  Intake 300 ml  Output 250 ml  Net 50 ml   Filed Weights   01/07/18 0404 01/07/18 0554  Weight: 66.5 kg (146 lb 9.6 oz) 65.6 kg (144 lb 11.2 oz)    Exam:  . General: 62 y.o. year-old male well developed well nourished in no acute distress.  Alert and oriented x3. . Cardiovascular: Regular rate and rhythm with no rubs  or gallops.  No thyromegaly or JVD noted.   Marland Kitchen Respiratory: Clear to auscultation with no wheezes or rales. Good inspiratory effort. . Abdomen: Soft nontender nondistended with normal bowel sounds x4 quadrants. . Musculoskeletal: Trace edema in lower extremity edema. 2/4 pulses in all 4 extremities. . Skin: No ulcerative lesions  noted or rashes . Psychiatry: Mood is appropriate for condition and setting   Data Reviewed: CBC: Recent Labs  Lab 01/06/18 2136 01/07/18 0553  WBC 11.3* 8.6  HGB 11.2* 10.0*  HCT 34.4* 31.0*  MCV 84.5 84.2  PLT 350 323   Basic Metabolic Panel: Recent Labs  Lab 01/06/18 2136 01/07/18 0553 01/08/18 0544  NA 143 143 142  K 3.5 3.7 3.7  CL 114* 113* 112*  CO2 23 25 23   GLUCOSE 121* 101* 97  BUN 29* 32* 29*  CREATININE 2.82* 2.89* 2.72*  CALCIUM 8.6* 8.4* 8.4*  MG 1.9 1.9 2.2  PHOS  --  3.7  --    GFR: Estimated Creatinine Clearance: 26.8 mL/min (A) (by C-G formula based on SCr of 2.72 mg/dL (H)). Liver Function Tests: Recent Labs  Lab 01/06/18 2136 01/07/18 0553 01/08/18 0544  AST 12* 11* 9*  ALT 16* 13* 12*  ALKPHOS 62 55 52  BILITOT 0.3 0.4 0.4  PROT 6.3* 5.4* 5.4*  ALBUMIN 2.5* 2.3* 2.2*   Recent Labs  Lab 01/06/18 2136  LIPASE 36   No results for input(s): AMMONIA in the last 168 hours. Coagulation Profile: No results for input(s): INR, PROTIME in the last 168 hours. Cardiac Enzymes: No results for input(s): CKTOTAL, CKMB, CKMBINDEX, TROPONINI in the last 168 hours. BNP (last 3 results) No results for input(s): PROBNP in the last 8760 hours. HbA1C: No results for input(s): HGBA1C in the last 72 hours. CBG: Recent Labs  Lab 01/07/18 1352 01/07/18 1825  GLUCAP 100* 121*   Lipid Profile: No results for input(s): CHOL, HDL, LDLCALC, TRIG, CHOLHDL, LDLDIRECT in the last 72 hours. Thyroid Function Tests: Recent Labs    01/07/18 0553  TSH 1.209   Anemia Panel: No results for input(s): VITAMINB12, FOLATE, FERRITIN, TIBC, IRON, RETICCTPCT in the last 72 hours. Urine analysis:    Component Value Date/Time   COLORURINE YELLOW 01/06/2018 2341   APPEARANCEUR CLEAR 01/06/2018 2341   LABSPEC 1.014 01/06/2018 2341   PHURINE 6.0 01/06/2018 2341   GLUCOSEU 150 (A) 01/06/2018 2341   HGBUR NEGATIVE 01/06/2018 2341   BILIRUBINUR NEGATIVE 01/06/2018  2341   KETONESUR NEGATIVE 01/06/2018 2341   PROTEINUR >=300 (A) 01/06/2018 2341   NITRITE NEGATIVE 01/06/2018 2341   LEUKOCYTESUR NEGATIVE 01/06/2018 2341   Sepsis Labs: @LABRCNTIP (procalcitonin:4,lacticidven:4)  )No results found for this or any previous visit (from the past 240 hour(s)).    Studies: No results found.  Scheduled Meds: . allopurinol  100 mg Oral Daily  . [START ON 01/09/2018] amLODipine  10 mg Oral Daily  . atorvastatin  20 mg Oral q1800  . citalopram  40 mg Oral Daily  . enoxaparin (LOVENOX) injection  30 mg Subcutaneous Daily  . folic acid  1 mg Oral Daily  . furosemide  160 mg Oral BID  . hydrALAZINE  50 mg Oral BID  . lamoTRIgine  100 mg Oral BID  . levETIRAcetam  500 mg Oral BID  . metoprolol tartrate  50 mg Oral BID  . multivitamin with minerals  1 tablet Oral Daily    Continuous Infusions:   LOS: 1 day     Darlin Drop, MD [facility] Pager [phone]  If 7PM-7AM, please contact night-coverage [email or web address] Password [facility] 01/08/2018, 12:11 PM

## 2018-01-08 NOTE — Progress Notes (Signed)
Subjective: Interval History: has no complaint , feels better.  Objective: Vital signs in last 24 hours: Temp:  [97.3 F (36.3 C)-98.2 F (36.8 C)] 97.3 F (36.3 C) (06/16 0555) Pulse Rate:  [48-53] 48 (06/16 0555) Resp:  [13-17] 17 (06/16 0555) BP: (149-178)/(57-80) 169/77 (06/16 0555) SpO2:  [99 %-100 %] 99 % (06/16 0555) Weight change:   Intake/Output from previous day: 06/15 0701 - 06/16 0700 In: 300 [P.O.:300] Out: 250 [Urine:250] Intake/Output this shift: No intake/output data recorded.  General appearance: alert, cooperative and no distress Resp: clear to auscultation bilaterally Cardio: S1, S2 normal and systolic murmur: systolic ejection 2/6, decrescendo at 2nd left intercostal space GI: soft, pos bs, liver down 4 cm Extremities: edema 2+  Lab Results: Recent Labs    01/06/18 2136 01/07/18 0553  WBC 11.3* 8.6  HGB 11.2* 10.0*  HCT 34.4* 31.0*  PLT 350 323   BMET:  Recent Labs    01/07/18 0553 01/08/18 0544  NA 143 142  K 3.7 3.7  CL 113* 112*  CO2 25 23  GLUCOSE 101* 97  BUN 32* 29*  CREATININE 2.89* 2.72*  CALCIUM 8.4* 8.4*   No results for input(s): PTH in the last 72 hours. Iron Studies: No results for input(s): IRON, TIBC, TRANSFERRIN, FERRITIN in the last 72 hours.  Studies/Results: [place] Renal  Result Date: 01/07/2018 CLINICAL DATA:  Acute renal failure.  Hypertension. EXAM: RENAL / URINARY TRACT ULTRASOUND COMPLETE COMPARISON:  None. FINDINGS: Right Kidney: Length: 13.0 cm. Mild right-sided hydronephrosis. Increased renal echogenicity. Left Kidney: Length: 13.7 cm.  Increased renal echogenicity.  No hydronephrosis. Bladder: Mild bladder wall thickening. IMPRESSION: 1. Increased renal echogenicity, suggesting medical renal disease. 2. Mild right-sided hydronephrosis. 3. Bladder wall thickening, possibly representing outlet obstruction. Cystitis could look similar. Electronically Signed   By: Jeronimo GreavesKyle  Talbot M.D.   On: 01/07/2018 09:48    I have  reviewed the patient's current medications.  Assessment/Plan: 1 ^ Cr not sure has acute component at all. Need records to eval. Has NS with ^^protein in urine and past course ??  Needs diuretic 2 HTN needs diuretic and less of other meds 3 Anemia P Lasix, lower hydral , please get records.    LOS: 1 day   Fayrene FearingJames Creek Gan 01/08/2018,11:01 AM

## 2018-01-08 NOTE — Progress Notes (Signed)
Nutrition Brief Note  RD consulted for nutritional assessment.  Patient currently eating well, 75-100% of meals. Pt has not reported weight loss or poor appetite. Any weight changes are related to fluid.   Wt Readings from Last 15 Encounters:  01/07/18 144 lb 11.2 oz (65.6 kg)  12/30/14 157 lb (71.2 kg)    Body mass index is 22 kg/m. Patient meets criteria for normal based on current BMI.   Current diet order is renal, patient is consuming approximately 100% of meals at this time. Labs and medications reviewed.   No nutrition interventions warranted at this time. If nutrition issues arise, please consult RD.   Jonathan FrancoLindsey [name], MS, RD, LDN [facility] Clinical Dietitian Pager: [phone] After Hours Pager: [phone]

## 2018-01-09 DIAGNOSIS — N179 Acute kidney failure, unspecified: Principal | ICD-10-CM

## 2018-01-09 DIAGNOSIS — N049 Nephrotic syndrome with unspecified morphologic changes: Secondary | ICD-10-CM

## 2018-01-09 DIAGNOSIS — I10 Essential (primary) hypertension: Secondary | ICD-10-CM

## 2018-01-09 DIAGNOSIS — G40909 Epilepsy, unspecified, not intractable, without status epilepticus: Secondary | ICD-10-CM

## 2018-01-09 LAB — RENAL FUNCTION PANEL
Albumin: 2.2 g/dL — ABNORMAL LOW (ref 3.5–5.0)
Anion gap: 6 (ref 5–15)
BUN: 29 mg/dL — AB (ref 6–20)
CALCIUM: 8.7 mg/dL — AB (ref 8.9–10.3)
CO2: 23 mmol/L (ref 22–32)
CREATININE: 2.55 mg/dL — AB (ref 0.61–1.24)
Chloride: 113 mmol/L — ABNORMAL HIGH (ref 101–111)
GFR, EST AFRICAN AMERICAN: 30 mL/min — AB (ref >60)
GFR, EST NON AFRICAN AMERICAN: 26 mL/min — AB (ref >60)
Glucose, Bld: 89 mg/dL (ref 65–99)
Phosphorus: 3.5 mg/dL (ref 2.5–4.6)
Potassium: 3.6 mmol/L (ref 3.5–5.1)
SODIUM: 142 mmol/L (ref 135–145)

## 2018-01-09 MED ORDER — FUROSEMIDE 80 MG PO TABS: 80.0000 mg | ORAL_TABLET | Freq: Two times a day (BID) | ORAL | 0 refills | Status: DC

## 2018-01-09 MED ORDER — AMLODIPINE BESYLATE 10 MG PO TABS: 10.0000 mg | ORAL_TABLET | Freq: Every day | ORAL | 0 refills | Status: AC

## 2018-01-09 MED ORDER — AMLODIPINE BESYLATE 10 MG PO TABS: 10.0000 mg | ORAL_TABLET | Freq: Every day | ORAL | 0 refills | Status: DC

## 2018-01-09 MED ORDER — LEVETIRACETAM 500 MG PO TABS: 500.0000 mg | ORAL_TABLET | Freq: Two times a day (BID) | ORAL | 0 refills | Status: DC

## 2018-01-09 MED ORDER — FUROSEMIDE 40 MG PO TABS: 80.0000 mg | ORAL_TABLET | Freq: Two times a day (BID) | ORAL | Status: DC

## 2018-01-09 NOTE — Progress Notes (Signed)
[facility] ROUNDING NOTE   Subjective:   This is a 62 year old man with nephrotic range proteinuria and diagnosis of membraneous glomerulonephropathy   He has a long standing history of hypertention . He is followed by the [facility] hospital in [place].  He has had no history of abnormal creatinine although will need to confirm   He had some edema on his initial exam although this has resolved. His ARB  Losartan was held due to concerns of acute kidney injury that was hemodynamically mediated.  There was some some mild hydronephrosis on his ultrasound and it appears from the chart that urology were curb sided and were not alarmed at this point  The blood pressure control is better today and his creatinine is somewhat improved   Objective:  Vital signs in last 24 hours:  Temp:  [97.5 F (36.4 C)-98.4 F (36.9 C)] 98.3 F (36.8 C) (06/17 0547) Pulse Rate:  [49-63] 62 (06/17 0915) Resp:  [17] 17 (06/17 0547) BP: (130-191)/(57-80) 157/69 (06/17 0547) SpO2:  [98 %-100 %] 98 % (06/17 0547)  Weight change:  Filed Weights   01/07/18 0404 01/07/18 0554  Weight: 146 lb 9.6 oz (66.5 kg) 144 lb 11.2 oz (65.6 kg)    Intake/Output: I/O last 3 completed shifts: In: 1500 [P.O.:1500] Out: 500 [Urine:500]   Intake/Output this shift:  Total I/O In: 360 [P.O.:360] Out: -   CVS- RRR RS- CTA  ABD- BS present soft non-distended EXT- no edema   Basic Metabolic Panel: Recent Labs  Lab 01/06/18 2136 01/07/18 0553 01/08/18 0544 01/09/18 0543  NA 143 143 142 142  K 3.5 3.7 3.7 3.6  CL 114* 113* 112* 113*  CO2 23 25 23 23   GLUCOSE 121* 101* 97 89  BUN 29* 32* 29* 29*  CREATININE 2.82* 2.89* 2.72* 2.55*  CALCIUM 8.6* 8.4* 8.4* 8.7*  MG 1.9 1.9 2.2  --   PHOS  --  3.7  --  3.5    Liver Function Tests: Recent Labs  Lab 01/06/18 2136 01/07/18 0553 01/08/18 0544 01/09/18 0543  AST 12* 11* 9*  --   ALT 16* 13* 12*  --   ALKPHOS 62 55 52  --   BILITOT 0.3 0.4 0.4  --    PROT 6.3* 5.4* 5.4*  --   ALBUMIN 2.5* 2.3* 2.2* 2.2*   Recent Labs  Lab 01/06/18 2136  LIPASE 36   No results for input(s): AMMONIA in the last 168 hours.  CBC: Recent Labs  Lab 01/06/18 2136 01/07/18 0553  WBC 11.3* 8.6  HGB 11.2* 10.0*  HCT 34.4* 31.0*  MCV 84.5 84.2  PLT 350 323    Cardiac Enzymes: No results for input(s): CKTOTAL, CKMB, CKMBINDEX, TROPONINI in the last 168 hours.  BNP: Invalid input(s): POCBNP  CBG: Recent Labs  Lab 01/07/18 1352 01/07/18 1825  GLUCAP 100* 121*    Microbiology: No results found for this or any previous visit.  Coagulation Studies: No results for input(s): LABPROT, INR in the last 72 hours.  Urinalysis: Recent Labs    01/06/18 2341  COLORURINE YELLOW  LABSPEC 1.014  PHURINE 6.0  GLUCOSEU 150*  HGBUR NEGATIVE  BILIRUBINUR NEGATIVE  KETONESUR NEGATIVE  PROTEINUR >=300*  NITRITE NEGATIVE  LEUKOCYTESUR NEGATIVE      Imaging: No results found.   Medications:    . allopurinol  100 mg Oral Daily  . amLODipine  10 mg Oral Daily  . atorvastatin  20 mg Oral q1800  . citalopram  40 mg Oral Daily  . enoxaparin (LOVENOX) injection  30 mg Subcutaneous Daily  . folic acid  1 mg Oral Daily  . furosemide  160 mg Oral BID  . hydrALAZINE  50 mg Oral BID  . lamoTRIgine  100 mg Oral BID  . levETIRAcetam  500 mg Oral BID  . metoprolol tartrate  50 mg Oral BID  . multivitamin with minerals  1 tablet Oral Daily   acetaminophen **OR** acetaminophen, hydrALAZINE, HYDROcodone-acetaminophen, ondansetron **OR** ondansetron (ZOFRAN) IV, traZODone  Assessment/ Plan:    Acute renal failure  Creatinine apparently increased to over 2  Although we have no records of previous creatinines at this time and these would be very helpful. The losartan was held and he was diuresed with lasix  This has helped tremendously with the edema and I would certainly continue although perhaps use a lower dose  80mg  BID . He has known membraneous  nephropathy and this is a very challenging condition to treat as [facility] , I think it better that they continue his care and would hold the ARB until he sees them next. The hydronephrosis is mild and urology were not excited, as the creatinine is improving  I am ok with this approach. I think that he could leave today and [facility] for [facility] labs and assess edema.  Hypertension appears to be better controlled  Would continue to avoid arb and ace and [facility] as an [facility]. Decrease lasix to 80 mg BID     LOS: 2 Sabryn Preslar W @TODAY @10 :29 AM

## 2018-01-09 NOTE — Discharge Summary (Signed)
Physician Discharge Summary  Jonathan HoltsDavid Tencza  WUJ:[id number]  DOB: [birth year]  DOA: 01/06/2018 PCP: Patient, No Pcp Per  Admit date: 01/06/2018 Discharge date: 01/09/2018  Admitted From: Home  Disposition:  Home   Recommendations for Outpatient Follow-up:  1. Follow up with PCP in 1-2 weeks 2. [facility]  3. Please obtain BMP in one week  Discharge Condition: Stable  CODE STATUS: Full code  Diet recommendation: Heart Healthy   Brief/Interim Summary: For full details see H&P/Progress note, but in brief, Jonathan HoltsDavid Pierce is a is a 62 year old male with past medical history of membranous nephropathy, hypertension, seizure disorder, gout who was sent to the ED by his primary care provider due to AKI and persistent proteinuria.  Upon ED evaluation he was found to be severe hypertensive, with elevated creatinine and UA positive for proteinuria.  Patient was admitted with working diagnosis of septic urgency complicated with AKI in setting of membranous nephropathy.   Subjective: Patient seen and examined, he has no complaints.  Edema has significantly improved.  Denies chest pain, shortness of breath and palpitations.  Tolerating diet well.  Afebrile.  Ambulating with no issues  Discharge Diagnoses/Hospital Course:  AKI in the setting of membranous nephropathy ARB and hypertensive urgency probably contributing Unclear baseline creatinine.  Creatinine has improved with holding losartan. Patient also was treated with Lasix, with improvement the edema Renal ultrasound revealed mild right side hydronephrosis, no urologic intervention need at this time.  Patient with good urine output and tolerating oral hydration well.  Avoid nephrotoxic agents and hypotension Recommend to continue to hold ARB as this has help, [facility] patient will follow up with them in 1 week. Advised on low salt intake and fluid restriction. Case discussed with nephrology and  deemed patient stable for discharge. 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  On the day of the discharge the patient's vitals were stable, and no other acute medical condition were reported by patient. the patient was felt safe to be discharge to home.   Discharge Instructions  You were cared for by a hospitalist during your hospital stay. If you have any questions about your discharge medications or the care you received while you were in the hospital after you are discharged, you can call the unit and asked to speak with the hospitalist on call if the hospitalist that took care of you is not available. Once you are discharged, your primary care physician will handle any further medical issues. Please note that NO REFILLS for any discharge medications will be authorized once you are discharged, as it is imperative that you return to your primary care physician (or establish a relationship with a primary care physician if you do not have one) for your aftercare needs so that they can reassess your need for medications and monitor your  lab values.  Discharge Instructions    Call MD for:  difficulty breathing, headache or visual disturbances   Complete by:  As directed    Call MD for:  extreme fatigue   Complete by:  As directed    Call MD for:  hives   Complete by:  As directed    Call MD  for:  persistant dizziness or light-headedness   Complete by:  As directed    Call MD for:  persistant nausea and vomiting   Complete by:  As directed    Call MD for:  redness, tenderness, or signs of infection (pain, swelling, redness, odor or green/yellow discharge around incision site)   Complete by:  As directed    Call MD for:  severe uncontrolled pain   Complete by:  As directed    Call MD for:  temperature >100.4   Complete by:  As directed    Diet - low sodium heart healthy   Complete by:  As directed    Increase activity slowly   Complete by:  As directed      Allergies as of 01/09/2018   No Known Allergies     Medication List    STOP taking these medications   losartan 100 MG tablet Commonly known as:  COZAAR     TAKE these medications   allopurinol 100 MG tablet Commonly known as:  ZYLOPRIM Take 100 mg by mouth daily.   amLODipine 10 MG tablet Commonly known as:  NORVASC Take 1 tablet (10 mg total) by mouth daily. Start taking on:  01/10/2018   atorvastatin 20 MG tablet Commonly known as:  LIPITOR Take 20 mg by mouth daily at 6 PM.   citalopram 40 MG tablet Commonly known as:  CELEXA Take 40 mg by mouth daily. What changed:  Another medication with the same name was removed. Continue taking this medication, and follow the directions you see here.   furosemide 80 MG tablet Commonly known as:  LASIX Take 1 tablet (80 mg total) by mouth 2 (two) times daily. What changed:    medication strength  how much to take  when to take this   hydrALAZINE 50 MG tablet Commonly known as:  APRESOLINE Take 50 mg by mouth 3 (three) times daily.   hydrOXYzine 25 MG tablet Commonly known as:  ATARAX/VISTARIL Take 1 tablet (25 mg total) by mouth every 6 (six) hours as needed for anxiety.   lamoTRIgine 100 MG tablet Commonly known as:  LAMICTAL Take 100 mg by mouth 2 (two) times daily.   levETIRAcetam 500 MG tablet Commonly known as:  KEPPRA Take 1 tablet (500  mg total) by mouth 2 (two) times daily. What changed:    how much to take  Another medication with the same name was removed. Continue taking this medication, and follow the directions you see here.   metoprolol tartrate 50 MG tablet Commonly known as:  LOPRESSOR Take 50 mg by mouth 2 (two) times daily.   traZODone 50 MG tablet Commonly known as:  DESYREL Take 1 tablet (50 mg total) by mouth at bedtime and may repeat dose one time if needed. What changed:    when to take this  reasons to take this  additional instructions   Vitamin D (Ergocalciferol) 50000 units Caps capsule Commonly known as:  DRISDOL Take 50,000 Units by mouth every 7 (seven) days.       No Known Allergies  Consultations: Nephrology  Procedures/Studies: US Renal  Result Date:  01/07/2018 CLINICAL DATA:  Acute renal failure.  Hypertension. EXAM: RENAL / URINARY TRACT ULTRASOUND COMPLETE COMPARISON:  None. FINDINGS: Right Kidney: Length: 13.0 cm. Mild right-sided hydronephrosis. Increased renal echogenicity. Left Kidney: Length: 13.7 cm.  Increased renal echogenicity.  No hydronephrosis. Bladder: Mild bladder wall thickening. IMPRESSION: 1. Increased renal echogenicity, suggesting medical renal disease. 2. Mild right-sided hydronephrosis. 3. Bladder wall thickening, possibly representing outlet obstruction. Cystitis could look similar. Electronically Signed   By: Jeronimo Greaves M.D.   On: 01/07/2018 09:48    Discharge Exam: Vitals:   01/09/18 0547 01/09/18 0915  BP: (!) 157/69   Pulse: (!) 49 62  Resp: 17   Temp: 98.3 F (36.8 C)   SpO2: 98%    Vitals:   01/08/18 2134 01/09/18 0101 01/09/18 0547 01/09/18 0915  BP: (!) 178/73 (!) 157/78 (!) 157/69   Pulse: (!) 49 (!) 49 (!) 49 62  Resp: 17  17   Temp: 98.4 F (36.9 C)  98.3 F (36.8 C)   TempSrc: Oral  Oral   SpO2: 100%  98%   Weight:      Height:        General: Pt is alert, awake, not in acute distress Cardiovascular: RRR, S1/S2 +, no  rubs, no gallops Respiratory: CTA bilaterally, no wheezing, no rhonchi Abdominal: Soft, NT, ND, bowel sounds + Extremities: Trace b/l LE edema, no cyanosis   The results of significant diagnostics from this hospitalization (including imaging, microbiology, ancillary and laboratory) are listed below for reference.     Microbiology: No results found for this or any previous visit (from the past 240 hour(s)).   Labs: BNP (last 3 results) Recent Labs    01/06/18 2136  BNP 133.2*   Basic Metabolic Panel: Recent Labs  Lab 01/06/18 2136 01/07/18 0553 01/08/18 0544 01/09/18 0543  NA 143 143 142 142  K 3.5 3.7 3.7 3.6  CL 114* 113* 112* 113*  CO2 23 25 23 23   GLUCOSE 121* 101* 97 89  BUN 29* 32* 29* 29*  CREATININE 2.82* 2.89* 2.72* 2.55*  CALCIUM 8.6* 8.4* 8.4* 8.7*  MG 1.9 1.9 2.2  --   PHOS  --  3.7  --  3.5   Liver Function Tests: Recent Labs  Lab 01/06/18 2136 01/07/18 0553 01/08/18 0544 01/09/18 0543  AST 12* 11* 9*  --   ALT 16* 13* 12*  --   ALKPHOS 62 55 52  --   BILITOT 0.3 0.4 0.4  --   PROT 6.3* 5.4* 5.4*  --   ALBUMIN 2.5* 2.3* 2.2* 2.2*   Recent Labs  Lab 01/06/18 2136  LIPASE 36   No results for input(s): AMMONIA in the last 168 hours. CBC: Recent Labs  Lab 01/06/18 2136 01/07/18 0553  WBC 11.3* 8.6  HGB 11.2* 10.0*  HCT 34.4* 31.0*  MCV 84.5 84.2  PLT 350 323   Cardiac Enzymes: No results for input(s): CKTOTAL, CKMB, CKMBINDEX, TROPONINI in the last 168 hours. BNP: Invalid input(s): POCBNP CBG: Recent Labs  Lab 01/07/18 1352 01/07/18 1825  GLUCAP 100* 121*   D-Dimer No results for input(s): DDIMER in the last 72 hours. Hgb A1c No results for input(s): HGBA1C in the last 72 hours. Lipid Profile No results for input(s): CHOL, HDL, LDLCALC, TRIG, CHOLHDL, LDLDIRECT in the last 72 hours. Thyroid function studies Recent Labs    01/07/18 0553  TSH 1.209   Anemia work up No results for input(s): VITAMINB12, FOLATE, FERRITIN,  TIBC, IRON, RETICCTPCT in  the last 72 hours. Urinalysis    Component Value Date/Time   COLORURINE YELLOW 01/06/2018 2341   APPEARANCEUR CLEAR 01/06/2018 2341   LABSPEC 1.014 01/06/2018 2341   PHURINE 6.0 01/06/2018 2341   GLUCOSEU 150 (A) 01/06/2018 2341   HGBUR NEGATIVE 01/06/2018 2341   BILIRUBINUR NEGATIVE 01/06/2018 2341   KETONESUR NEGATIVE 01/06/2018 2341   PROTEINUR >=300 (A) 01/06/2018 2341   NITRITE NEGATIVE 01/06/2018 2341   LEUKOCYTESUR NEGATIVE 01/06/2018 2341   Sepsis Labs Invalid input(s): PROCALCITONIN,  WBC,  LACTICIDVEN Microbiology No results found for this or any previous visit (from the past 240 hour(s)).   Time coordinating discharge: 32 minutes  SIGNED:  Latrelle Dodrill, MD  [facility] 01/09/2018, 11:45 AM  Pager please text page via  [email or web address]  Note - This record has been created using [employer]. Chart creation errors have been sought, but may not always have been located. Such creation errors do not reflect on the standard of medical care.

## 2018-01-09 NOTE — Progress Notes (Signed)
Patient has discharged to home on 01/09/18. Discharge instruction including medication and appointment was given to patient, patient has no question at this time.

## 2018-11-09 IMAGING — US US RENAL
1 series · 14 of 25 positions shown · non-contrast
Comparison: None.

CLINICAL DATA: Acute renal failure.  Hypertension.

EXAM:
RENAL / URINARY TRACT ULTRASOUND COMPLETE

[Series 1: us renal · 14 of 26 slices shown]
[im 1/26]
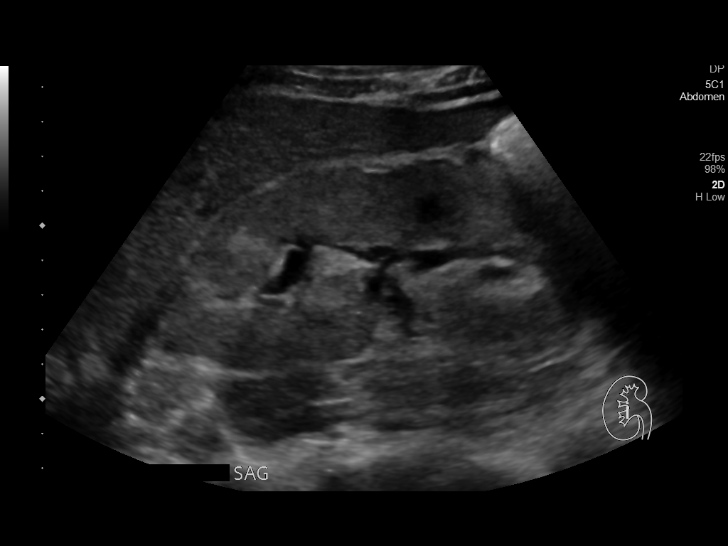
[im 3/26]
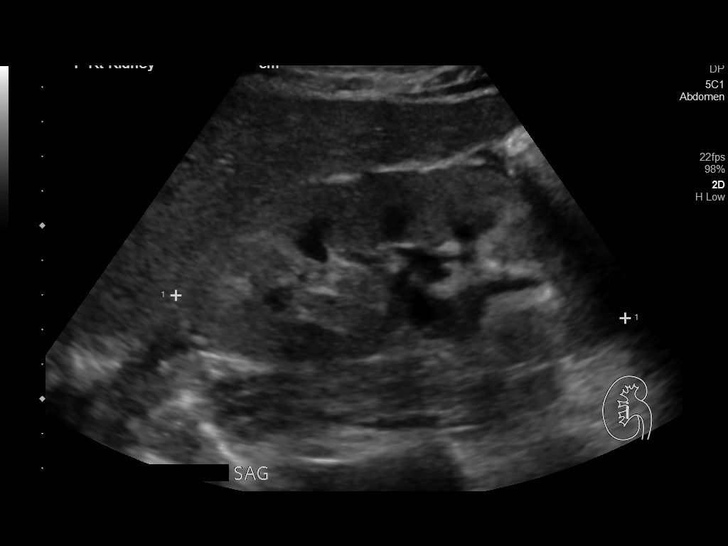
[im 5/26]
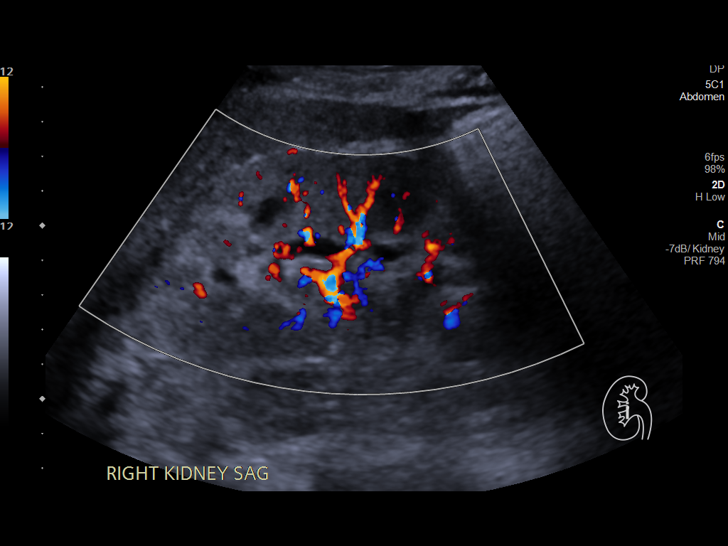
[im 7/26]
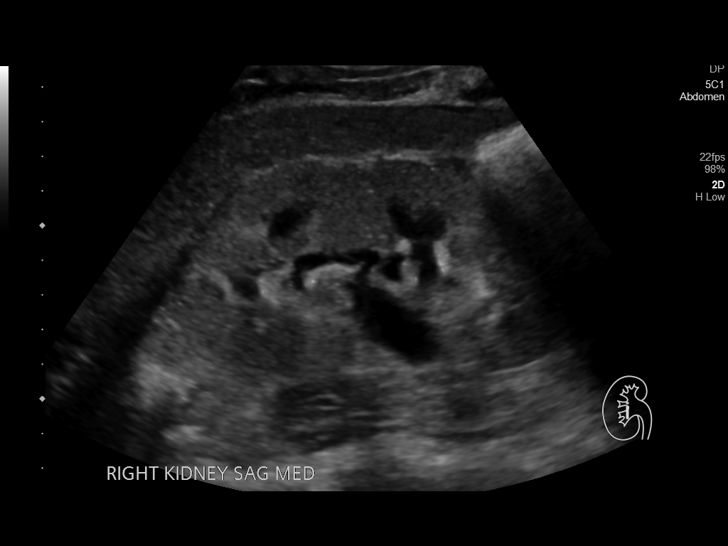
[im 9/26]
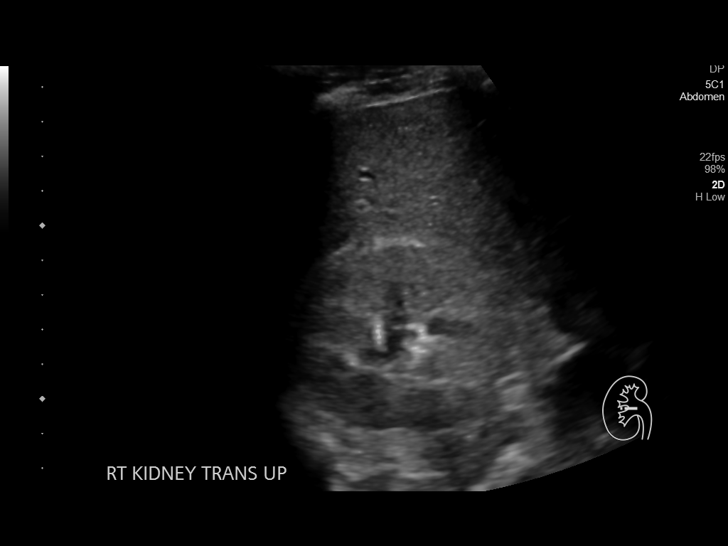
[im 10/26]
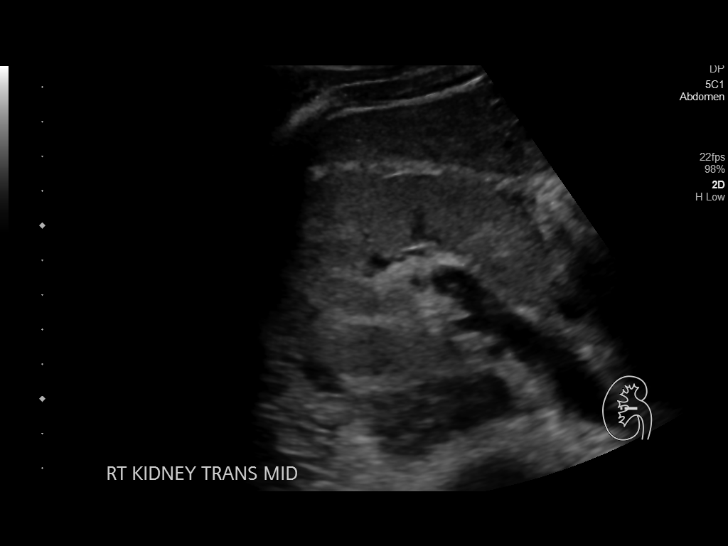
[im 12/26]
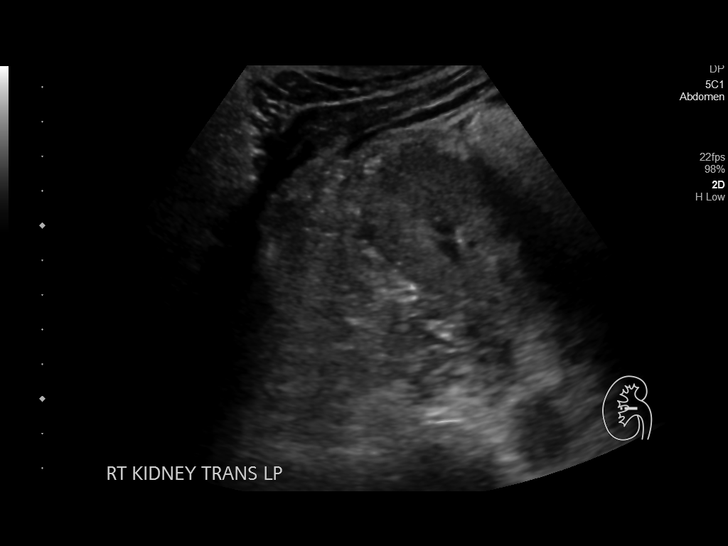
[im 14/26]
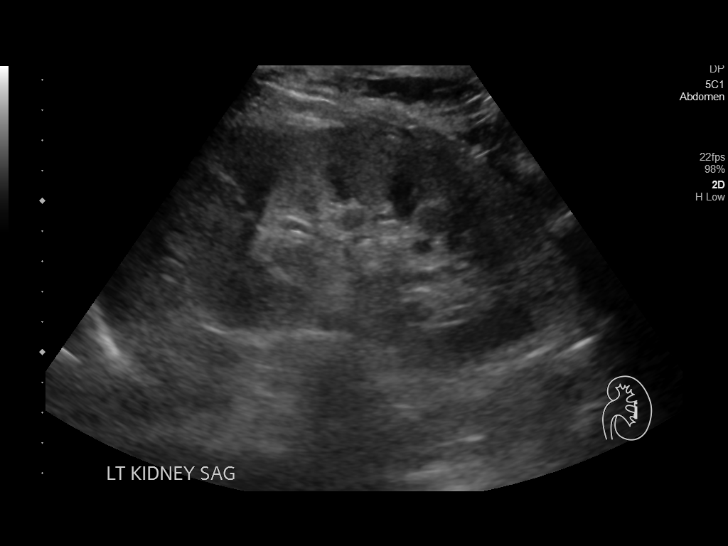
[im 16/26]
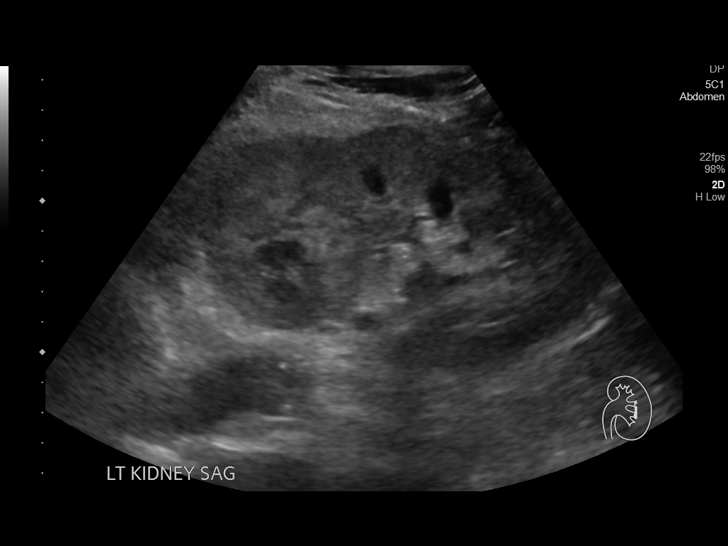
[im 17/26]
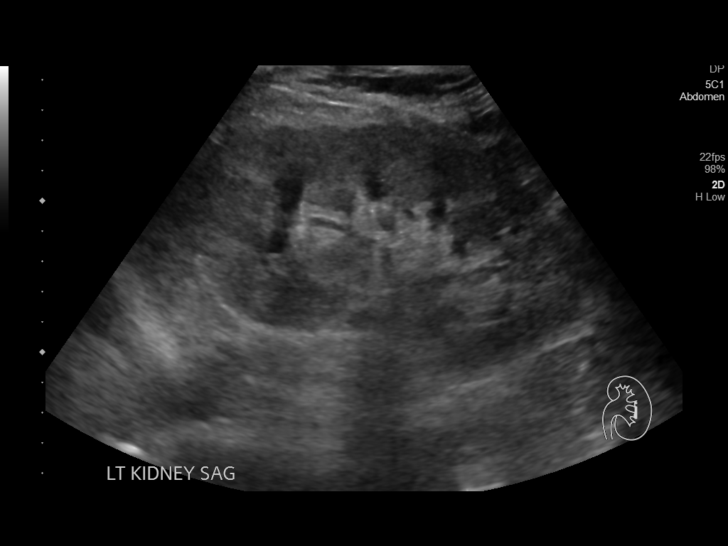
[im 19/26]
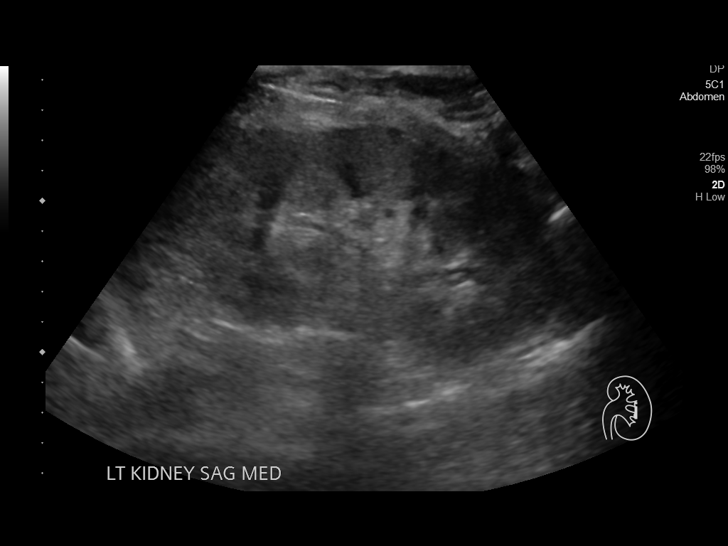
[im 21/26]
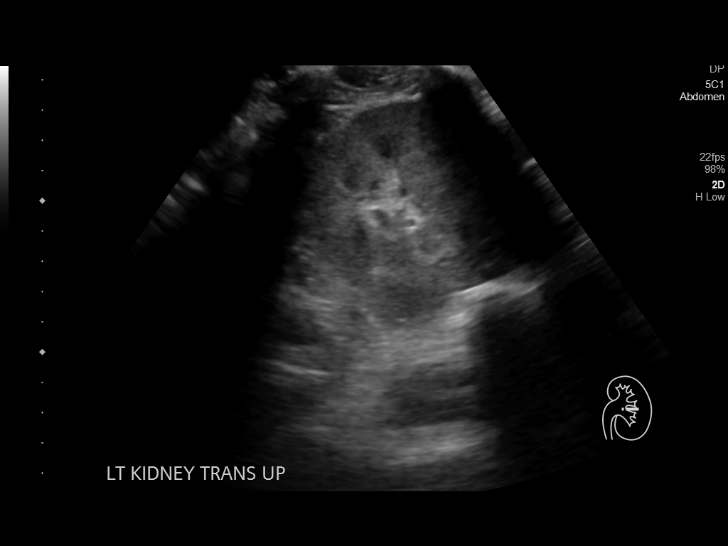
[im 23/26]
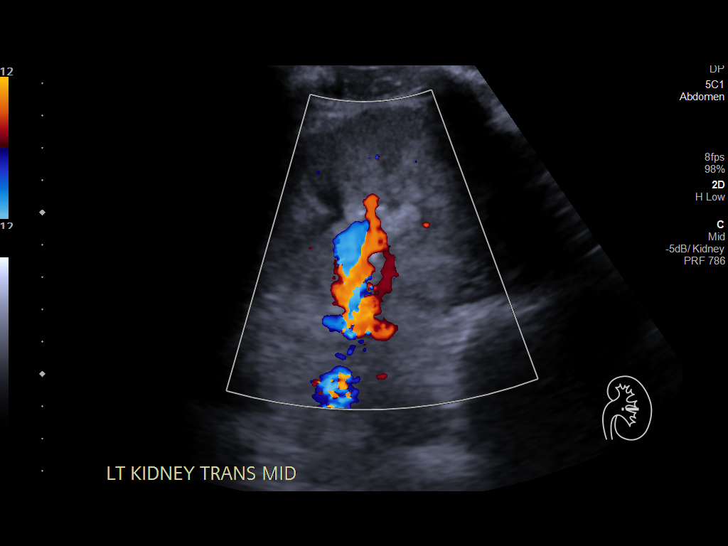
[im 26/26]
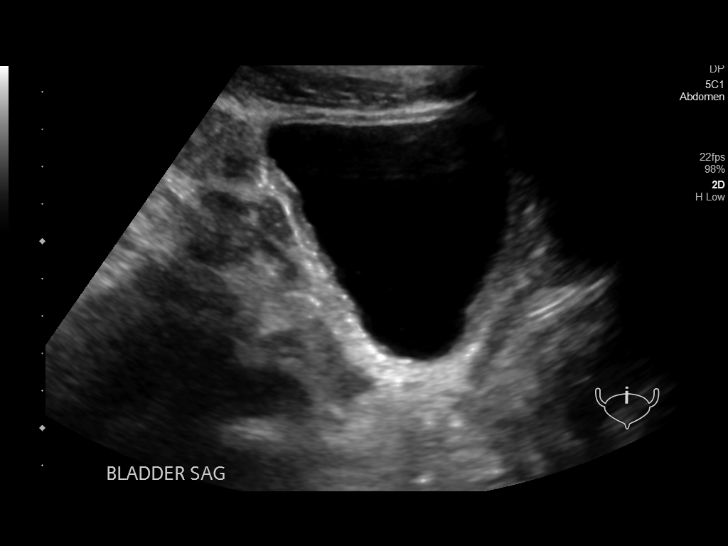

[14 of 25 positions shown; findings below may reference images not displayed]

FINDINGS: Right Kidney:

Length: 13.0 cm. Mild right-sided hydronephrosis.. Increased renal
echogenicity.

Left Kidney:

Length: 13.7 cm.  Increased renal echogenicity.  No hydronephrosis.

Bladder:

Mild bladder wall thickening.
IMPRESSION: 1. Increased renal echogenicity, suggesting medical renal disease.
2. Mild right-sided hydronephrosis.
3. Bladder wall thickening, possibly representing outlet
obstruction. Cystitis could look similar.

## 2019-11-09 ENCOUNTER — Emergency Department (HOSPITAL_COMMUNITY)
Admission: EM | Admit: 2019-11-09 | Discharge: 2019-11-09 | Disposition: A | Discharge status: Home / Self Care | Payer: No Typology Code available for payment source | Attending: Emergency Medicine | Admitting: Emergency Medicine

## 2019-11-09 ENCOUNTER — Other Ambulatory Visit: Payer: Self-pay

## 2019-11-09 ENCOUNTER — Encounter (HOSPITAL_COMMUNITY): Payer: Self-pay

## 2019-11-09 DIAGNOSIS — Z79899 Other long term (current) drug therapy: Secondary | ICD-10-CM | POA: Insufficient documentation

## 2019-11-09 DIAGNOSIS — F17210 Nicotine dependence, cigarettes, uncomplicated: Secondary | ICD-10-CM | POA: Diagnosis not present

## 2019-11-09 DIAGNOSIS — Z760 Encounter for issue of repeat prescription: Secondary | ICD-10-CM | POA: Diagnosis not present

## 2019-11-09 DIAGNOSIS — R000 Tachycardia, unspecified: Secondary | ICD-10-CM | POA: Insufficient documentation

## 2019-11-09 MED ORDER — LEVETIRACETAM 750 MG PO TABS: 750.0000 mg | ORAL_TABLET | Freq: Three times a day (TID) | ORAL | 0 refills | Status: DC

## 2019-11-09 MED ORDER — LEVETIRACETAM IN NACL 1000 MG/100ML IV SOLN
1000.0000 mg | Freq: Once | INTRAVENOUS | Status: AC
  Administered 2019-11-09: 1000 mg via INTRAVENOUS
  Filled 2019-11-09: qty 100

## 2019-11-09 NOTE — ED Provider Notes (Signed)
[place] DEPT Provider Note   CSN: [id number] Arrival date & time: 11/09/19  1812     History Chief Complaint  Patient presents with  . Medication Refill    Jonathan Pierce is a 64 y.o. male.  HPI   Patient is a 64 year old male with a history of depression, seizures, who presents the emergency department today for medication refill.  He states he ran out of his Keppra about 3 days ago.  [facility] and it has not arrived at his house yet.  He denies any breakthrough seizures.  He denies any other symptoms at all.  In triage, it was noted that his heart rate was elevated and he appeared to be in atrial fibrillation.  He denies any symptoms at all related to this.  Past Medical History:  Diagnosis Date  . Depression   . Seizures [facility])     Patient Active Problem List   Diagnosis Date Noted  . Hypokalemia 01/07/2018  . Seizure disorder ([place]) 01/07/2018  . Nephrotic syndrome 01/07/2018  . Accelerated hypertension 01/07/2018  . Tobacco abuse 01/07/2018  . AKI (acute kidney injury) ([place]) 01/07/2018  . Alcohol abuse 03/04/2015  . Drug overdose   . Major depressive disorder, recurrent, severe without psychotic features ([place])   . MDD (major depressive disorder), recurrent episode, severe ([place]) 12/31/2014    History reviewed. No pertinent surgical history.     Family History  Problem Relation Age of Onset  . Diabetes Sister   . CAD Neg Hx   . Cancer Neg Hx   . Stroke Neg Hx   . Hypertension Neg Hx   . Renal Disease Neg Hx     Social History   Tobacco Use  . Smoking status: Current Every Day Smoker    Packs/day: 0.25    Types: Cigarettes  . Smokeless tobacco: Never Used  Substance Use Topics  . Alcohol use: Yes  . Drug use: No    Home Medications Prior to Admission medications   Medication Sig Start Date End Date Taking? Authorizing Provider  amLODipine (NORVASC) 10 MG tablet  Take 1 tablet (10 mg total) by mouth daily. 01/10/18  Yes Patrecia Pour, Christean Grief, MD  furosemide (LASIX) 80 MG tablet Take 1 tablet (80 mg total) by mouth 2 (two) times daily. Patient not taking: Reported on 11/09/2019 01/09/18   Patrecia Pour, Christean Grief, MD  hydrOXYzine (ATARAX/VISTARIL) 25 MG tablet Take 1 tablet (25 mg total) by mouth every 6 (six) hours as needed for anxiety. Patient not taking: Reported on 11/09/2019 01/02/15   Kerrie Buffalo, NP  levETIRAcetam (KEPPRA) 750 MG tablet Take 1 tablet (750 mg total) by mouth in the morning, at noon, and at bedtime. 11/09/19   Jontavius Rabalais S, PA-C  traZODone (DESYREL) 50 MG tablet Take 1 tablet (50 mg total) by mouth at bedtime and may repeat dose one time if needed. Patient not taking: Reported on 11/09/2019 01/02/15   Kerrie Buffalo, NP    Allergies    Patient has no known allergies.  Review of Systems   Review of Systems  Constitutional: Negative for chills and fever.  HENT: Negative for ear pain and sore throat.   Eyes: Negative for pain and visual disturbance.  Respiratory: Negative for cough and shortness of breath.   Cardiovascular: Negative for chest pain.  Gastrointestinal: Negative for abdominal pain and vomiting.  Genitourinary: Negative for dysuria and hematuria.  Musculoskeletal: Negative for back  pain.  Skin: Negative for rash.  Neurological: Negative for seizures.  All other systems reviewed and are negative.   Physical Exam Updated Vital Signs BP (!) 198/95   Pulse (!) 55   Temp 97.8 F (36.6 C) (Oral)   Resp 12   Ht 5\' 7"  (1.702 m)   Wt 65.8 kg   SpO2 100%   BMI 22.71 kg/m   Physical Exam Vitals and nursing note reviewed.  Constitutional:      Appearance: He is well-developed.  HENT:     Head: Normocephalic and atraumatic.  Eyes:     Conjunctiva/sclera: Conjunctivae normal.  Cardiovascular:     Rate and Rhythm: Normal rate and regular rhythm.     Heart sounds: Normal heart sounds. No murmur.     Comments: NSR,  HR 61 on monitor Pulmonary:     Effort: Pulmonary effort is normal. No respiratory distress.     Breath sounds: Normal breath sounds.  Abdominal:     Palpations: Abdomen is soft.     Tenderness: There is no abdominal tenderness.  Musculoskeletal:     Cervical back: Neck supple.  Skin:    General: Skin is warm and dry.  Neurological:     Mental Status: He is alert.     Comments: Clear speech, moving all extremities     ED Results / Procedures / Treatments   Labs (all labs ordered are listed, but only abnormal results are displayed) Labs Reviewed - No data to display  EKG None  Radiology No results found.  Procedures Procedures (including critical care time)  Medications Ordered in ED Medications  levETIRAcetam (KEPPRA) IVPB 1000 mg/100 mL premix (1,000 mg Intravenous New Bag/Given 11/09/19 2042)    ED Course  I have reviewed the triage vital signs and the nursing notes.  Pertinent labs & imaging results that were available during my care of the patient were reviewed by me and considered in my medical decision making (see chart for details).    MDM Rules/Calculators/A&P                      64 year old male presenting for medication refill of his Keppra.  He states he takes 750 mg 3 times daily.  He does have his prescription with him to confirm this.  He has not had any breakthrough seizures.  He has no other complaints at this time  In triage, triage nurse noted an elevated heart rate and thought the patient may be in atrial fibrillation.  On my assessment he is in normal sinus rhythm on the monitor.  EKG shows NSR, LVH, nonspecific twave changes. No evidence of afib.  Will load him with Keppra and refill his Rx.  Advised her to follow-up with his neurologist and return to ED for new or worsening symptoms.  He voiced understanding plan reasons to return.  All questions answered.  Patient stable for discharge.  Final Clinical Impression(s) / ED Diagnoses Final  diagnoses:  Medication refill    Rx / DC Orders ED Discharge Orders         Ordered    levETIRAcetam (KEPPRA) 750 MG tablet  3 times daily     11/09/19 2018           2019, PA-C 11/09/19 2113    2114, MD 11/19/19 [phone]

## 2019-11-09 NOTE — ED Triage Notes (Signed)
Patient is here for Keppra refill. patiaent states he has been taking Keppra bid instead of ordered tid because his meds have not come in the mail yet.  When patient sat down in triage his HR was 177 and by the time the writer got the EKG leads on his HR was 44-51. Patient denies any chest pain or SOB.  Patient having intermittent atrial fib while in triage.

## 2019-11-09 NOTE — ED Notes (Signed)
Patient was made aware of BP. Instead of having this possibly addressed here, he chooses to go home to take his BP medication.

## 2019-11-09 NOTE — ED Provider Notes (Signed)
ED ECG REPORT   Date: 11/09/2019  Rate: 54  Rhythm: normal sinus rhythm  QRS Axis: normal  Intervals: normal  ST/T Wave abnormalities: nonspecific T wave changes and nonspecific ST/T changes  Conduction Disutrbances:none  Narrative Interpretation:   Old EKG Reviewed: none available  I have personally reviewed the EKG tracing and agree with the computerized printout as noted.    Vanetta Mulders, MD 11/09/19 2112

## 2019-11-09 NOTE — Discharge Instructions (Signed)
Please take medication as directed.   Please follow up with your neurologist.  Please return to the emergency department for any new or worsening symptoms.

## 2024-08-17 ENCOUNTER — Emergency Department (HOSPITAL_COMMUNITY)

## 2024-08-17 ENCOUNTER — Inpatient Hospital Stay (HOSPITAL_COMMUNITY)
Admission: EM | Admit: 2024-08-17 | Discharge: 2024-08-23 | DRG: 684 | Disposition: A | Discharge status: Home / Self Care | Attending: Internal Medicine | Admitting: Internal Medicine

## 2024-08-17 ENCOUNTER — Encounter (HOSPITAL_COMMUNITY): Payer: Self-pay | Admitting: Emergency Medicine

## 2024-08-17 DIAGNOSIS — I2510 Atherosclerotic heart disease of native coronary artery without angina pectoris: Secondary | ICD-10-CM | POA: Diagnosis present

## 2024-08-17 DIAGNOSIS — N250 Renal osteodystrophy: Secondary | ICD-10-CM | POA: Diagnosis present

## 2024-08-17 DIAGNOSIS — I129 Hypertensive chronic kidney disease with stage 1 through stage 4 chronic kidney disease, or unspecified chronic kidney disease: Secondary | ICD-10-CM | POA: Diagnosis present

## 2024-08-17 DIAGNOSIS — N179 Acute kidney failure, unspecified: Principal | ICD-10-CM | POA: Diagnosis present

## 2024-08-17 DIAGNOSIS — M109 Gout, unspecified: Secondary | ICD-10-CM | POA: Diagnosis present

## 2024-08-17 DIAGNOSIS — R569 Unspecified convulsions: Secondary | ICD-10-CM | POA: Diagnosis present

## 2024-08-17 DIAGNOSIS — D649 Anemia, unspecified: Principal | ICD-10-CM

## 2024-08-17 DIAGNOSIS — E8721 Acute metabolic acidosis: Secondary | ICD-10-CM | POA: Diagnosis present

## 2024-08-17 DIAGNOSIS — E559 Vitamin D deficiency, unspecified: Secondary | ICD-10-CM | POA: Diagnosis present

## 2024-08-17 DIAGNOSIS — F17210 Nicotine dependence, cigarettes, uncomplicated: Secondary | ICD-10-CM | POA: Diagnosis present

## 2024-08-17 DIAGNOSIS — D631 Anemia in chronic kidney disease: Secondary | ICD-10-CM | POA: Diagnosis present

## 2024-08-17 DIAGNOSIS — N184 Chronic kidney disease, stage 4 (severe): Secondary | ICD-10-CM | POA: Diagnosis present

## 2024-08-17 DIAGNOSIS — Z833 Family history of diabetes mellitus: Secondary | ICD-10-CM

## 2024-08-17 DIAGNOSIS — Z79899 Other long term (current) drug therapy: Secondary | ICD-10-CM

## 2024-08-17 DIAGNOSIS — N1330 Unspecified hydronephrosis: Secondary | ICD-10-CM | POA: Diagnosis present

## 2024-08-17 DIAGNOSIS — E785 Hyperlipidemia, unspecified: Secondary | ICD-10-CM | POA: Diagnosis present

## 2024-08-17 DIAGNOSIS — Z7982 Long term (current) use of aspirin: Secondary | ICD-10-CM

## 2024-08-17 LAB — URINALYSIS, ROUTINE W REFLEX MICROSCOPIC
Bacteria, UA: NONE SEEN
Bilirubin Urine: NEGATIVE
Glucose, UA: 150 mg/dL — AB
Hgb urine dipstick: NEGATIVE
Ketones, ur: NEGATIVE mg/dL
Leukocytes,Ua: NEGATIVE
Nitrite: NEGATIVE
Protein, ur: >=300 mg/dL — AB
Specific Gravity, Urine: 1.009 (ref 1.005–1.030)
pH: 7 (ref 5.0–8.0)

## 2024-08-17 LAB — ABO/RH: ABO/RH(D): O POS

## 2024-08-17 LAB — PROTIME-INR
INR: 1 (ref 0.8–1.2)
Prothrombin Time: 13.9 s (ref 11.4–15.2)

## 2024-08-17 LAB — CBC
HCT: 20 % — ABNORMAL LOW (ref 39.0–52.0)
Hemoglobin: 6.6 g/dL — CL (ref 13.0–17.0)
MCH: 27.7 pg (ref 26.0–34.0)
MCHC: 33 g/dL (ref 30.0–36.0)
MCV: 84 fL (ref 80.0–100.0)
Platelets: 409 K/uL — ABNORMAL HIGH (ref 150–400)
RBC: 2.38 MIL/uL — ABNORMAL LOW (ref 4.22–5.81)
RDW: 14.9 % (ref 11.5–15.5)
WBC: 9.3 K/uL (ref 4.0–10.5)
nRBC: 0 % (ref 0.0–0.2)

## 2024-08-17 LAB — BASIC METABOLIC PANEL WITH GFR
Anion gap: 16 — ABNORMAL HIGH (ref 5–15)
BUN: 81 mg/dL — ABNORMAL HIGH (ref 8–23)
CO2: 23 mmol/L (ref 22–32)
Calcium: 8.7 mg/dL — ABNORMAL LOW (ref 8.9–10.3)
Chloride: 101 mmol/L (ref 98–111)
Creatinine, Ser: 6.44 mg/dL — ABNORMAL HIGH (ref 0.61–1.24)
GFR, Estimated: 9 mL/min — ABNORMAL LOW
Glucose, Bld: 110 mg/dL — ABNORMAL HIGH (ref 70–99)
Potassium: 3.7 mmol/L (ref 3.5–5.1)
Sodium: 140 mmol/L (ref 135–145)

## 2024-08-17 LAB — RETICULOCYTES
Immature Retic Fract: 8.1 % (ref 2.3–15.9)
RBC.: 2.39 MIL/uL — ABNORMAL LOW (ref 4.22–5.81)
Retic Count, Absolute: 52.1 K/uL (ref 19.0–186.0)
Retic Ct Pct: 2.2 % (ref 0.4–3.1)

## 2024-08-17 LAB — IRON AND TIBC
Iron: 95 ug/dL (ref 45–182)
Saturation Ratios: 35 % (ref 17.9–39.5)
TIBC: 273 ug/dL (ref 250–450)
UIBC: 178 ug/dL

## 2024-08-17 LAB — FERRITIN: Ferritin: 152 ng/mL (ref 24–336)

## 2024-08-17 LAB — VITAMIN B12: Vitamin B-12: 1055 pg/mL — ABNORMAL HIGH (ref 180–914)

## 2024-08-17 LAB — POC OCCULT BLOOD, ED: Fecal Occult Blood, POC: NEGATIVE

## 2024-08-17 LAB — PREPARE RBC (CROSSMATCH)

## 2024-08-17 LAB — FOLATE: Folate: 12.3 ng/mL

## 2024-08-17 MED ORDER — PANTOPRAZOLE SODIUM 40 MG IV SOLR
40.0000 mg | Freq: Once | INTRAVENOUS | Status: AC
  Administered 2024-08-17: 40 mg via INTRAVENOUS
  Filled 2024-08-17: qty 10

## 2024-08-17 MED ORDER — SODIUM CHLORIDE 0.9 % IV BOLUS
500.0000 mL | Freq: Once | INTRAVENOUS | Status: AC
  Administered 2024-08-17: 500 mL via INTRAVENOUS

## 2024-08-17 MED ORDER — SODIUM CHLORIDE 0.9% IV SOLUTION: Freq: Once | INTRAVENOUS | Status: DC

## 2024-08-17 NOTE — ED Triage Notes (Signed)
 Pt came in POV b/c [facility] to say [facility] showed Hgb 6.4.

## 2024-08-17 NOTE — ED Notes (Signed)
 Pt waiting in lobby comfortably with family, This RN introduced self and updated on POC

## 2024-08-17 NOTE — ED Notes (Signed)
 Post void residual 27ml per bladder scanner

## 2024-08-17 NOTE — ED Provider Notes (Signed)
 " [place] [facility] Provider Note   CSN: [id number] Arrival date & time: 08/17/24  8190     Patient presents with: No chief complaint on file.   Jonathan Pierce is a 69 y.o. male.   Pt is a 69 yo male with pmhx significant for HTN, depression, seizures, CKD, tobacco abuse, alcohol abuse, sleep apnea, and anemia.  Pt went to the [place] and had routing labs which showed Hgb 6.4.  He was told to come here for further evaluation.  Pt said he feels ok.  He denies any known bleeding.  No black stools.         Prior to Admission medications  Medication Sig Start Date End Date Taking? Authorizing Provider  amLODipine  (NORVASC ) 10 MG tablet Take 1 tablet (10 mg total) by mouth daily. 01/10/18   Silva Zapata, Edwin, MD  furosemide  (LASIX ) 80 MG tablet Take 1 tablet (80 mg total) by mouth 2 (two) times daily. Patient not taking: Reported on 11/09/2019 01/09/18   Silva Zapata, Aliene, MD  hydrOXYzine  (ATARAX /VISTARIL ) 25 MG tablet Take 1 tablet (25 mg total) by mouth every 6 (six) hours as needed for anxiety. Patient not taking: Reported on 11/09/2019 01/02/15   Secundino Cones, NP  levETIRAcetam  (KEPPRA ) 750 MG tablet Take 1 tablet (750 mg total) by mouth in the morning, at noon, and at bedtime. 11/09/19   Couture, Cortni S, PA-C  traZODone  (DESYREL ) 50 MG tablet Take 1 tablet (50 mg total) by mouth at bedtime and may repeat dose one time if needed. Patient not taking: Reported on 11/09/2019 01/02/15   Secundino Cones, NP    Allergies: Patient has no known allergies.    Review of Systems  Constitutional:  Positive for fatigue.  All other systems reviewed and are negative.   Updated Vital Signs BP 139/72   Pulse 87   Temp 97.8 F (36.6 C) (Oral)   Resp 14   SpO2 100%   Physical Exam Vitals and nursing note reviewed.  Constitutional:      Appearance: Normal appearance.  HENT:     Head: Normocephalic and atraumatic.     Right Ear: External ear normal.      Left Ear: External ear normal.     Nose: Nose normal.     Mouth/Throat:     Mouth: Mucous membranes are moist.     Pharynx: Oropharynx is clear.  Eyes:     Extraocular Movements: Extraocular movements intact.     Conjunctiva/sclera: Conjunctivae normal.     Pupils: Pupils are equal, round, and reactive to light.  Cardiovascular:     Rate and Rhythm: Normal rate and regular rhythm.     Pulses: Normal pulses.     Heart sounds: Normal heart sounds.  Pulmonary:     Effort: Pulmonary effort is normal.     Breath sounds: Normal breath sounds.  Abdominal:     General: Abdomen is flat. Bowel sounds are normal.     Palpations: Abdomen is soft.  Musculoskeletal:     Cervical back: Normal range of motion and neck supple.  Skin:    General: Skin is warm.     Capillary Refill: Capillary refill takes less than 2 seconds.  Neurological:     General: No focal deficit present.     Mental Status: He is alert and oriented to person, place, and time.  Psychiatric:        Mood and Affect: Mood normal.  Behavior: Behavior normal.     (all labs ordered are listed, but only abnormal results are displayed) Labs Reviewed  CBC - Abnormal; Notable for the following components:      Result Value   RBC 2.38 (*)    Hemoglobin 6.6 (*)    HCT 20.0 (*)    Platelets 409 (*)    All other components within normal limits  BASIC METABOLIC PANEL WITH GFR - Abnormal; Notable for the following components:   Glucose, Bld 110 (*)    BUN 81 (*)    Creatinine, Ser 6.44 (*)    Calcium  8.7 (*)    GFR, Estimated 9 (*)    Anion gap 16 (*)    All other components within normal limits  URINALYSIS, ROUTINE W REFLEX MICROSCOPIC - Abnormal; Notable for the following components:   Color, Urine STRAW (*)    Glucose, UA 150 (*)    Protein, ur >=300 (*)    All other components within normal limits  VITAMIN B12 - Abnormal; Notable for the following components:   Vitamin B-12 1,055 (*)    All other components  within normal limits  RETICULOCYTES - Abnormal; Notable for the following components:   RBC. 2.39 (*)    All other components within normal limits  POC OCCULT BLOOD, ED - Normal  PROTIME-INR  FOLATE  IRON AND TIBC  FERRITIN  TYPE AND SCREEN  ABO/RH  PREPARE RBC (CROSSMATCH)    EKG: EKG Interpretation Date/Time:  Friday August 17 2024 18:46:52 EST Ventricular Rate:  91 PR Interval:  174 QRS Duration:  174 QT Interval:  370 QTC Calculation: 455 R Axis:   79  Text Interpretation: Normal sinus rhythm Non-specific intra-ventricular conduction block Left ventricular hypertrophy with repolarization abnormality ( Cornell product ) Abnormal ECG When compared with ECG of 09-Nov-2019 20:53, PREVIOUS ECG IS PRESENT new t wave changes Confirmed by Dean Clarity [phone]) on 08/17/2024 8:27:04 PM  Radiology: No results found.   Procedures   Medications Ordered in the ED  0.9 %  sodium chloride  infusion (Manually program via Guardrails IV Fluids) ( Intravenous Not Given 08/17/24 2058)  pantoprazole (PROTONIX) injection 40 mg (40 mg Intravenous Given 08/17/24 2128)  sodium chloride  0.9 % bolus 500 mL (0 mLs Intravenous Stopped 08/17/24 2235)                                    Medical Decision Making Amount and/or Complexity of Data Reviewed Labs: ordered. Radiology: ordered.  Risk Prescription drug management. Decision regarding hospitalization.   This patient presents to the ED for concern of anemia, this involves an extensive number of treatment options, and is a complaint that carries with it a high risk of complications and morbidity.  The differential diagnosis includes gi bleed, other source   Co morbidities that complicate the patient evaluation  HTN, depression, seizures, CKD, tobacco abuse, alcohol abuse, sleep apnea, and anemia.   Additional history obtained:  Additional history obtained from epic chart review External records from outside source obtained and  reviewed including family   Lab Tests:  I Ordered, and personally interpreted labs.  The pertinent results include:  cbc with hgb low at 6.6 (8.5 on 05/05/24); bun 81 and cr 6.44 (bun 56 and cr 3.7 on 05/05/24)   Imaging Studies ordered:  I agree with the radiologist interpretation   Cardiac Monitoring:  The patient was maintained on a cardiac monitor.  I personally viewed and interpreted the cardiac monitored which showed an underlying rhythm of: nsr   Medicines ordered and prescription drug management:  I ordered medication including transfusion  for anemia  Reevaluation of the patient after these medicines showed that the patient improved I have reviewed the patients home medicines and have made adjustments as needed   Test Considered:  ct   Critical Interventions:  transfusion   Consultations Obtained:  I requested consultation with the hospitalist (Dr. Dena),  and discussed lab and imaging findings as well as pertinent plan - he will admit Pt d/w Dr. Tobie (nephrology) who recommends transfusion and ivfs.  If kidney fct still poor, re-consult in am.  Problem List / ED Course:  Symptomatic anemia:  pt given transfusion in ED.  Etiology likely multifactorial.  Pt does have ckd.  No  hx gi bleeding.  Guaiac neg, but hx alcohol abuse (quit in October) Worsening kidney function:  pt given ivfs   Reevaluation:  After the interventions noted above, I reevaluated the patient and found that they have :improved   Social Determinants of Health:  Lives at home   Dispostion:  After consideration of the diagnostic results and the patients response to treatment, I feel that the patent would benefit from admission.    CRITICAL CARE Performed by: Mliss Boyers   Total critical care time: 30 minutes  Critical care time was exclusive of separately billable procedures and treating other patients.  Critical care was necessary to treat or prevent imminent or  life-threatening deterioration.  Critical care was time spent personally by me on the following activities: development of treatment plan with patient and/or surrogate as well as nursing, discussions with consultants, evaluation of patient's response to treatment, examination of patient, obtaining history from patient or surrogate, ordering and performing treatments and interventions, ordering and review of laboratory studies, ordering and review of radiographic studies, pulse oximetry and re-evaluation of patient's condition.      Final diagnoses:  Symptomatic anemia  Acute renal failure superimposed on stage 4 chronic kidney disease, unspecified acute renal failure type [facility])    ED Discharge Orders     None          Boyers Mliss, MD 08/17/24 2339  "

## 2024-08-18 ENCOUNTER — Other Ambulatory Visit: Payer: Self-pay

## 2024-08-18 ENCOUNTER — Inpatient Hospital Stay (HOSPITAL_COMMUNITY)

## 2024-08-18 LAB — COMPREHENSIVE METABOLIC PANEL WITH GFR
ALT: 9 U/L (ref 0–44)
AST: 11 U/L — ABNORMAL LOW (ref 15–41)
Albumin: 3 g/dL — ABNORMAL LOW (ref 3.5–5.0)
Alkaline Phosphatase: 50 U/L (ref 38–126)
Anion gap: 14 (ref 5–15)
BUN: 76 mg/dL — ABNORMAL HIGH (ref 8–23)
CO2: 18 mmol/L — ABNORMAL LOW (ref 22–32)
Calcium: 8.5 mg/dL — ABNORMAL LOW (ref 8.9–10.3)
Chloride: 106 mmol/L (ref 98–111)
Creatinine, Ser: 6.35 mg/dL — ABNORMAL HIGH (ref 0.61–1.24)
GFR, Estimated: 9 mL/min — ABNORMAL LOW
Glucose, Bld: 110 mg/dL — ABNORMAL HIGH (ref 70–99)
Potassium: 3.4 mmol/L — ABNORMAL LOW (ref 3.5–5.1)
Sodium: 138 mmol/L (ref 135–145)
Total Bilirubin: 0.3 mg/dL (ref 0.0–1.2)
Total Protein: 5.6 g/dL — ABNORMAL LOW (ref 6.5–8.1)

## 2024-08-18 LAB — BPAM RBC
Blood Product Expiration Date: 202602152359
ISSUE DATE / TIME: 202601232043
Unit Type and Rh: 5100

## 2024-08-18 LAB — TYPE AND SCREEN
ABO/RH(D): O POS
Antibody Screen: NEGATIVE
Unit division: 0

## 2024-08-18 LAB — CBC
HCT: 20.1 % — ABNORMAL LOW (ref 39.0–52.0)
Hemoglobin: 6.7 g/dL — CL (ref 13.0–17.0)
MCH: 28.2 pg (ref 26.0–34.0)
MCHC: 33.3 g/dL (ref 30.0–36.0)
MCV: 84.5 fL (ref 80.0–100.0)
Platelets: 352 10*3/uL (ref 150–400)
RBC: 2.38 MIL/uL — ABNORMAL LOW (ref 4.22–5.81)
RDW: 14.8 % (ref 11.5–15.5)
WBC: 7.3 10*3/uL (ref 4.0–10.5)
nRBC: 0 % (ref 0.0–0.2)

## 2024-08-18 MED ORDER — HYDRALAZINE HCL 50 MG PO TABS
100.0000 mg | ORAL_TABLET | Freq: Three times a day (TID) | ORAL | Status: DC
  Administered 2024-08-18 – 2024-08-23 (×16): 100 mg via ORAL
  Filled 2024-08-18 (×16): qty 2

## 2024-08-18 MED ORDER — CHLORTHALIDONE 25 MG PO TABS: 25.0000 mg | ORAL_TABLET | Freq: Every day | ORAL | Status: DC

## 2024-08-18 MED ORDER — ALLOPURINOL 100 MG PO TABS
100.0000 mg | ORAL_TABLET | Freq: Every day | ORAL | Status: DC
  Administered 2024-08-18 – 2024-08-20 (×3): 100 mg via ORAL
  Filled 2024-08-18 (×3): qty 1

## 2024-08-18 MED ORDER — SENNOSIDES-DOCUSATE SODIUM 8.6-50 MG PO TABS: 1.0000 | ORAL_TABLET | Freq: Every evening | ORAL | Status: DC | PRN

## 2024-08-18 MED ORDER — ASPIRIN 81 MG PO CHEW
81.0000 mg | CHEWABLE_TABLET | Freq: Every morning | ORAL | Status: DC
  Administered 2024-08-18 – 2024-08-23 (×6): 81 mg via ORAL
  Filled 2024-08-18 (×6): qty 1

## 2024-08-18 MED ORDER — ACETAMINOPHEN 325 MG PO TABS: 650.0000 mg | ORAL_TABLET | Freq: Four times a day (QID) | ORAL | Status: DC | PRN

## 2024-08-18 MED ORDER — ONDANSETRON HCL 4 MG PO TABS: 4.0000 mg | ORAL_TABLET | Freq: Four times a day (QID) | ORAL | Status: DC | PRN

## 2024-08-18 MED ORDER — MINOXIDIL 2.5 MG PO TABS
2.5000 mg | ORAL_TABLET | Freq: Every day | ORAL | Status: DC
  Administered 2024-08-18 – 2024-08-22 (×5): 2.5 mg via ORAL
  Filled 2024-08-18 (×6): qty 1

## 2024-08-18 MED ORDER — ONDANSETRON HCL 4 MG/2ML IJ SOLN: 4.0000 mg | Freq: Four times a day (QID) | INTRAMUSCULAR | Status: DC | PRN

## 2024-08-18 MED ORDER — HEPARIN SODIUM (PORCINE) 5000 UNIT/ML IJ SOLN
5000.0000 [IU] | Freq: Three times a day (TID) | INTRAMUSCULAR | Status: DC
  Administered 2024-08-18 – 2024-08-23 (×16): 5000 [IU] via SUBCUTANEOUS
  Filled 2024-08-18 (×16): qty 1

## 2024-08-18 MED ORDER — LACTATED RINGERS IV SOLN: INTRAVENOUS | Status: AC

## 2024-08-18 MED ORDER — LEVETIRACETAM 500 MG PO TABS
500.0000 mg | ORAL_TABLET | Freq: Two times a day (BID) | ORAL | Status: DC
  Administered 2024-08-18 – 2024-08-23 (×12): 500 mg via ORAL
  Filled 2024-08-18 (×12): qty 1

## 2024-08-18 MED ORDER — AMLODIPINE BESYLATE 10 MG PO TABS
10.0000 mg | ORAL_TABLET | Freq: Every day | ORAL | Status: DC
  Administered 2024-08-18 – 2024-08-23 (×6): 10 mg via ORAL
  Filled 2024-08-18 (×6): qty 1

## 2024-08-18 MED ORDER — ATORVASTATIN CALCIUM 40 MG PO TABS
40.0000 mg | ORAL_TABLET | Freq: Every day | ORAL | Status: DC
  Administered 2024-08-18 – 2024-08-22 (×5): 40 mg via ORAL
  Filled 2024-08-18 (×5): qty 1

## 2024-08-18 MED ORDER — ACETAMINOPHEN 650 MG RE SUPP: 650.0000 mg | Freq: Four times a day (QID) | RECTAL | Status: DC | PRN

## 2024-08-18 NOTE — Plan of Care (Signed)

## 2024-08-18 NOTE — Plan of Care (Signed)

## 2024-08-18 NOTE — Progress Notes (Signed)
 Same day note  Jonathan Pierce is a 69 y.o. male with medical history significant of depression, seizure, CKD who presents emergency department due to abnormal labs.  Patient had gone to the [place] when he was noted to have hemoglobin 6.4 so was referred to the ED.  Patient denied any overt bleeding.  In the ED hemoglobin was 6.6 with creatinine of 6.4 from baseline around 2.5.  Chest x-ray without any acute findings.  Patient was given 2 units of packed RBC and was admitted to the hospital for further evaluation and treatment.  Patient also received IV fluids.  Nephrology was consulted and patient presented to hospital for further evaluation and treatment.    Patient seen and examined at bedside.  Patient was admitted to the hospital for anemia  At the time of my evaluation, patient complains of fatigue and tiredness  Physical examination reveals with male with pallor.  Laboratory data and imaging was reviewed  Assessment and Plan.  Symptomatic anemia Likely likely anemia of chronic disease.  Patient presented with fatigue and tiredness.  Last hemoglobin noted was 11, 5 years back.  Hemoglobin on this presentation at 6.6.  Received 2 PRBC transfusion.  No evidence of bleeding.  FOBT showed FOBT negative, reticulocyte's within normal range.  INR of 1.0.  Iron panel with normal parameters including ferritin of 152 with iron of 95.  Vitamin B12 was elevated folic acid  at 12.3.  Labs from today pending  AKI on CKD stage IIIb-received IV fluids.  Creatinine on presentation at 6.4.  Baseline creatinine between 2.5-2.8.  Will continue with hydration.  Received 2 units of PRBC transfusion.  If no improvement will consult nephrology.  Labs from today pending.  hypertension-continue amlodipine , hydralazine   History of gout-on allopurinol .  Hold off for now.    CAD-continue aspirin .  No active issues.   Hyperlipidemia- continue Lipitor  History of seizure-continue Keppra    No Charge  Signed,  Vernal Anselm Alstrom, MD [facility]

## 2024-08-18 NOTE — Clinical Social Work Note (Signed)
 Clinical Social Work Assessment  Patient Details  Name: Jonathan Pierce MRN: [id number] Date of Birth: 02-May-1956  Date of referral:  08/18/24               Reason for consult:  Discharge Planning                Permission sought to share information with:  Family Supports Permission granted to share information::  Yes, Verbal Permission Granted  Name::     Nandan Willems  Agency::     Relationship::  Spouse  Contact Information:  [phone]  Housing/Transportation Living arrangements for the past 2 months:  Single Family Home Source of Information:  Patient Patient Interpreter Needed:  None Criminal Activity/Legal Involvement Pertinent to Current Situation/Hospitalization:  No - Comment as needed Significant Relationships:    Lives with:  Spouse Do you feel safe going back to the place where you live?  Yes Need for family participation in patient care:  No (Coment)  Care giving concerns:  Pt is independent with ADL's   Social Worker assessment / plan:  Pt currently has no discharge planning needs at this time.  Employment status:  [employer] information:  Medicare, [place] Benefit PT Recommendations:  Not assessed at this time Information / Referral to community resources:  Other (Comment Required) (No resources needed)  Patient/Family's Response to care:  N/A  Patient/Family's Understanding of and Emotional Response to Diagnosis, Current Treatment, and Prognosis:  N/A  Emotional Assessment Appearance:  Appears stated age Attitude/Demeanor/Rapport:  Engaged Affect (typically observed):  Appropriate, Pleasant Orientation:  Oriented to Self Alcohol / Substance use:  Not Applicable Psych involvement (Current and /or in the community):  No (Comment)  Discharge Needs  Concerns to be addressed:  No discharge needs identified, Denies Needs/Concerns at this time Readmission within the last 30 days:  No Current discharge risk:  None Barriers to Discharge:  No Barriers  Identified   Lendia Dais, LCSWA 08/18/2024, 9:07 AM

## 2024-08-18 NOTE — H&P (Signed)
 " History and Physical    Jonathan Pierce FMW:[id number] DOB: 10/09/1955 DOA: 08/17/2024  [facility]   Chief Complaint: anemia  HPI: Jonathan Pierce is a 69 y.o. male with medical history significant of depression, seizure who presents emergency department due to abnormal labs.  Patient went to the [place] and was found to have a hemoglobin of 6.4.  He was referred to the ER for further assessment.  He denies any overt bleeding.  On arrival he was afebrile and hemodynamically stable.  Labs were obtained which showed hemoglobin 6.6, WBC 9.3.  Baseline hemoglobin is around 10.  Creatinine 6.4 baseline around 2.5, B12 1000, reticulocytes 2.2, urinalysis negative for infection.  Patient had chest x-ray which showed no acute findings.  Patient was given 2 units of blood transfusion and admitted for further workup.  On admission he denied complaints.  He was given IV fluids.  Nephrology was consulted and recommended fluids and resuscitation.  If creatinine fails to improve consult nephrology.   Review of Systems: Review of Systems  All other systems reviewed and are negative.    As per HPI otherwise 10 point review of systems negative.   Allergies[1]  Past Medical History:  Diagnosis Date   Depression    Seizures (HCC)     History reviewed. No pertinent surgical history.   reports that he has been smoking cigarettes. He has never used smokeless tobacco. He reports current alcohol use. He reports that he does not use drugs.  Family History  Problem Relation Age of Onset   Diabetes Sister    CAD Neg Hx    Cancer Neg Hx    Stroke Neg Hx    Hypertension Neg Hx    Renal Disease Neg Hx     Prior to Admission medications  Medication Sig Start Date End Date Taking? Authorizing Provider  allopurinol  (ZYLOPRIM ) 100 MG tablet Take 100 mg by mouth daily. 02/16/17  Yes [provider]  amLODipine  (NORVASC ) 10 MG tablet Take 1 tablet (10 mg total) by mouth daily. 01/10/18  Yes Nikki Hansel Atlas, MD  aspirin  81 MG chewable tablet Chew 81 mg by mouth every morning. 06/06/24  Yes [provider]  atorvastatin  (LIPITOR) 40 MG tablet Take 40 mg by mouth at bedtime. 06/07/24  Yes [provider]  chlorthalidone  (HYGROTON ) 25 MG tablet Take 25 mg by mouth daily. for high blood pressure 06/06/24  Yes [provider]  hydrALAZINE  (APRESOLINE ) 100 MG tablet Take 100 mg by mouth 3 (three) times daily. 06/07/24  Yes [provider]  levETIRAcetam  (KEPPRA ) 500 MG tablet Take 500 mg by mouth 2 (two) times daily. 06/07/24  Yes [provider]  minoxidil  (LONITEN ) 2.5 MG tablet Take 2.5 mg by mouth at bedtime. 06/06/24  Yes [provider]    Physical Exam: Vitals:   08/17/24 2330 08/17/24 2345 08/18/24 0056 08/18/24 0103  BP: 135/73 136/74 139/71   Pulse: 86 82 91   Resp: (!) 9 10 18    Temp:   98 F (36.7 C)   TempSrc:   Oral   SpO2: 100% 100% 99%   Weight:    63.4 kg  Height:    5' 7 (1.702 m)   Physical Exam Constitutional:      Appearance: He is normal weight.  HENT:     Head: Normocephalic.     Nose: Nose normal.     Mouth/Throat:     Mouth: Mucous membranes are moist.  Pharynx: Oropharynx is clear.  Eyes:     Extraocular Movements: Extraocular movements intact.     Pupils: Pupils are equal, round, and reactive to light.  Cardiovascular:     Rate and Rhythm: Normal rate and regular rhythm.     Pulses: Normal pulses.     Heart sounds: Normal heart sounds.  Pulmonary:     Effort: Pulmonary effort is normal.  Abdominal:     General: Abdomen is flat. Bowel sounds are normal.     Palpations: Abdomen is soft.  Musculoskeletal:        General: Normal range of motion.     Cervical back: Normal range of motion.  Skin:    General: Skin is warm.     Capillary Refill: Capillary refill takes less than 2 seconds.  Neurological:     General: No focal deficit present.     Mental Status: He is alert. Mental  status is at baseline.  Psychiatric:        Mood and Affect: Mood normal.        Labs on Admission: I have personally reviewed the patients's labs and imaging studies.  Assessment/Plan Principal Problem:   Acute renal failure   # Symptomatic anemia - Patient endorsing tiredness - Hemoglobin 5 years ago was 11  Plan: Transfuse to hemoglobin goal of 7 Status post volume resuscitation Patient will need outpatient workup of anemia as patient is currently not having overt bleeding  # AKI on CKD stage IIIb-trend creatinine-status post IV fluids  Plan: Consult nephrology if patient fails to respond to resuscitation overnight plan # hypertension-continue amlodipine , hydralazine  # History of gout-continue allopurinol   # CAD-continue aspirin   # Hyperlipidemia-continue Lipitor # History of seizure-continue Keppra    Admission status: Inpatient Telemetry  Certification: The appropriate patient status for this patient is INPATIENT. Inpatient status is judged to be reasonable and necessary in order to provide the required intensity of service to ensure the patient's safety. The patient's presenting symptoms, physical exam findings, and initial radiographic and laboratory data in the context of their chronic comorbidities is felt to place them at high risk for further clinical deterioration. Furthermore, it is not anticipated that the patient will be medically stable for discharge from the hospital within 2 midnights of admission.   * I certify that at the point of admission it is my clinical judgment that the patient will require inpatient hospital care spanning beyond 2 midnights from the point of admission due to high intensity of service, high risk for further deterioration and high frequency of surveillance required.Jonathan Pierce Lamar Dess MD [facility] If 7PM-7AM, please contact night-coverage [email or web address]  08/18/2024, 1:29 AM        [1] No Known Allergies  "

## 2024-08-18 NOTE — Hospital Course (Addendum)
 Jonathan Pierce

## 2024-08-19 LAB — CBC
HCT: 21.6 % — ABNORMAL LOW (ref 39.0–52.0)
Hemoglobin: 7.2 g/dL — ABNORMAL LOW (ref 13.0–17.0)
MCH: 28.1 pg (ref 26.0–34.0)
MCHC: 33.3 g/dL (ref 30.0–36.0)
MCV: 84.4 fL (ref 80.0–100.0)
Platelets: 364 10*3/uL (ref 150–400)
RBC: 2.56 MIL/uL — ABNORMAL LOW (ref 4.22–5.81)
RDW: 15.1 % (ref 11.5–15.5)
WBC: 6.9 10*3/uL (ref 4.0–10.5)
nRBC: 0 % (ref 0.0–0.2)

## 2024-08-19 LAB — BASIC METABOLIC PANEL WITH GFR
Anion gap: 12 (ref 5–15)
BUN: 71 mg/dL — ABNORMAL HIGH (ref 8–23)
CO2: 21 mmol/L — ABNORMAL LOW (ref 22–32)
Calcium: 8.7 mg/dL — ABNORMAL LOW (ref 8.9–10.3)
Chloride: 104 mmol/L (ref 98–111)
Creatinine, Ser: 5.92 mg/dL — ABNORMAL HIGH (ref 0.61–1.24)
GFR, Estimated: 10 mL/min — ABNORMAL LOW
Glucose, Bld: 152 mg/dL — ABNORMAL HIGH (ref 70–99)
Potassium: 3.5 mmol/L (ref 3.5–5.1)
Sodium: 137 mmol/L (ref 135–145)

## 2024-08-19 LAB — HIV ANTIBODY (ROUTINE TESTING W REFLEX): HIV Screen 4th Generation wRfx: NONREACTIVE

## 2024-08-19 MED ORDER — LACTATED RINGERS IV SOLN: INTRAVENOUS | Status: DC

## 2024-08-19 NOTE — Progress Notes (Signed)
 " PROGRESS NOTE  Jonathan Pierce FMW:[id number] DOB: [birth year] DOA: 08/17/2024 [facility]: [facility]   LOS: 2 days   Brief narrative:   Jonathan Pierce is a 69 y.o. male with medical history significant of depression, seizure, CKD who presents emergency department due to abnormal labs.  Patient had gone to the [place] when he was noted to have hemoglobin 6.4 so was referred to the ED.  Patient denied any overt bleeding.  In the ED, hemoglobin was 6.6 with creatinine of 6.4 from baseline around 2.5.  Chest x-ray without any acute findings.  Patient was given 2 units of packed RBC and was admitted to the hospital for further evaluation and treatment.  Patient also received IV fluids.  Nephrology was consulted and patient presented to the hospital for further evaluation and treatment.    Assessment/Plan: Principal Problem:   Acute renal failure  Symptomatic anemia Likely likely anemia of chronic disease.  Patient presented with fatigue and tiredness.  Last hemoglobin noted was 11, 5 years back.  Hemoglobin on this presentation at 6.6.  Received 1 PRBC transfusion.  No evidence of bleeding. FOBT negative, reticulocyte's within normal range.  INR of 1.0.  Iron panel with normal parameters including ferritin of 152 with iron of 95.  Vitamin B12 was elevated folic acid  at 12.3.     AKI on CKD stage IIIb-received IV fluids.  Creatinine on presentation at 6.4.  Baseline creatinine between 2.5-2.8.  Will continue with hydration.  Received 1 units of PRBC transfusion.  If no improvement in renal function will consult nephrology.  Labs from today pending.  Will order stat labs   hypertension-continue amlodipine , hydralazine    History of gout-on allopurinol .  Hold off for now.    CAD-continue aspirin .  No active issues.   Hyperlipidemia- continue Lipitor   History of seizure-continue Keppra   DVT prophylaxis: heparin  injection 5,000 Units Start: 08/18/24 0600 SCDs Start: 08/18/24 0128   Disposition:  Likely home in 1 to 2 days  Status is: Inpatient Remains inpatient appropriate because: Pending clinical improvement    Code Status:     Code Status: Full Code  Family Communication: None  Consultants: None  Procedures: None  Anti-infectives:  None  Anti-infectives (From admission, onward)    None        Subjective: Today, patient was seen and examined at bedside.  Patient denies any nausea vomiting fever chills or rigor.  As per the patient had a good night.  Had bowel movement yesterday.  Objective: Vitals:   08/19/24 0438 08/19/24 0925  BP: 131/72 137/72  Pulse: 82 83  Resp: 18 18  Temp: 98 F (36.7 C) 98 F (36.7 C)  SpO2: 100% 100%    Intake/Output Summary (Last 24 hours) at 08/19/2024 1043 Last data filed at 08/18/2024 1500 Gross per 24 hour  Intake 429 ml  Output --  Net 429 ml   Filed Weights   08/18/24 0103  Weight: 63.4 kg   Body mass index is 21.89 kg/m.   Physical Exam: GENERAL: Patient is alert awake and oriented. Not in obvious distress. HENT: Mild pallor noted. Pupils equally reactive to light. Oral mucosa is moist NECK: is supple, no gross swelling noted. CHEST: Clear to auscultation. No crackles or wheezes.  Diminished breath sounds bilaterally. CVS: S1 and S2 heard, no murmur. Regular rate and rhythm.  ABDOMEN: Soft, non-tender, bowel sounds are present. EXTREMITIES: No edema. CNS: Cranial nerves are intact. No focal motor deficits. SKIN: warm and dry without rashes.  Data Review: I have personally reviewed the following laboratory data and studies,  CBC: Recent Labs  Lab 08/17/24 1848 08/18/24 1149  WBC 9.3 7.3  HGB 6.6* 6.7*  HCT 20.0* 20.1*  MCV 84.0 84.5  PLT 409* 352   Basic Metabolic Panel: Recent Labs  Lab 08/17/24 1848 08/18/24 1149  NA 140 138  K 3.7 3.4*  CL 101 106  CO2 23 18*  GLUCOSE 110* 110*  BUN 81* 76*  CREATININE 6.44* 6.35*  CALCIUM  8.7* 8.5*   Liver Function Tests: Recent Labs  Lab  08/18/24 1149  AST 11*  ALT 9  ALKPHOS 50  BILITOT 0.3  PROT 5.6*  ALBUMIN 3.0*   No results for input(s): LIPASE, AMYLASE in the last 168 hours. No results for input(s): AMMONIA in the last 168 hours. Cardiac Enzymes: No results for input(s): CKTOTAL, CKMB, CKMBINDEX, TROPONINI in the last 168 hours. BNP (last 3 results) No results for input(s): BNP in the last 8760 hours.  ProBNP (last 3 results) No results for input(s): PROBNP in the last 8760 hours.  CBG: No results for input(s): GLUCAP in the last 168 hours. No results found for this or any previous visit (from the past 240 hours).   Studies: DG Chest Portable 1 View Result Date: 08/18/2024 EXAM: 1 VIEW(S) XRAY OF THE CHEST 08/18/2024 12:21:40 AM COMPARISON: None available. CLINICAL HISTORY: Anemia. FINDINGS: LUNGS AND PLEURA: No focal pulmonary opacity. No pleural effusion. No pneumothorax. HEART AND MEDIASTINUM: Cardiomegaly. Aortic arch atherosclerosis. BONES AND SOFT TISSUES: No acute osseous abnormality. IMPRESSION: 1. Cardiomegaly. 2. No acute findings. Electronically signed by: Morgane Naveau MD 08/18/2024 12:30 AM EST RP Workstation: [id number]      Jonathan Alstrom, MD  [facility] 08/19/2024  If 7PM-7AM, please contact night-coverage             "

## 2024-08-20 ENCOUNTER — Inpatient Hospital Stay (HOSPITAL_COMMUNITY)

## 2024-08-20 LAB — VITAMIN D 25 HYDROXY (VIT D DEFICIENCY, FRACTURES): Vit D, 25-Hydroxy: 9.1 ng/mL — ABNORMAL LOW (ref 30–100)

## 2024-08-20 MED ORDER — LACTATED RINGERS IV SOLN: INTRAVENOUS | Status: DC

## 2024-08-20 MED ORDER — DARBEPOETIN ALFA 100 MCG/0.5ML IJ SOSY
100.0000 ug | PREFILLED_SYRINGE | INTRAMUSCULAR | Status: DC
  Administered 2024-08-20: 100 ug via SUBCUTANEOUS
  Filled 2024-08-20: qty 0.5

## 2024-08-20 NOTE — Plan of Care (Signed)

## 2024-08-20 NOTE — Progress Notes (Signed)
 " PROGRESS NOTE  Jonathan Pierce FMW:[id number] DOB: August 16, 1955 DOA: 08/17/2024 [facility]: [facility]   LOS: 3 days   Brief narrative:   Jonathan Pierce is a 69 y.o. male with medical history significant of depression, seizure, CKD who presents emergency department due to abnormal labs.  Patient had gone to the [facility] when he was noted to have hemoglobin 6.4 so was referred to the ED.  Patient denied any overt bleeding.  In the ED, hemoglobin was 6.6 with creatinine of 6.4 from baseline around 2.5.  Chest x-ray without any acute findings.  Patient was given 2 units of packed RBC and was admitted to the hospital for further evaluation and treatment.  Patient also received IV fluids.  Patient was then admitted to the hospital for further evaluation and treatment.    Assessment/Plan: Principal Problem:   Acute renal failure  Symptomatic anemia Likely likely anemia of chronic disease/CKD.  Patient presented with fatigue and tiredness.  Last hemoglobin noted was 11, 5 years back.  Hemoglobin on this presentation at 6.6.  Received 1 PRBC transfusion with improvement of hemoglobin to 7.2..  No evidence of bleeding. FOBT negative, reticulocyte's within normal range.  INR of 1.0.  Iron panel with normal parameters including ferritin of 152 with iron of 95.  Vitamin B12 was elevated folic acid  at 12.3.  Will get nephrology evaluation.   AKI on CKD stage IIIb- on LR 50 mL/h.  Creatinine on presentation at 6.4.  Baseline creatinine between 2.5-2.8.  Will continue with hydration.  Received 1 units of PRBC transfusion.  Creatinine today was 5.9 with mild improvement.  Will get ultrasound of the kidneys.  Urinalysis appears to bland.  Communicated with nephrology for consultation.    Essential hypertension-continue amlodipine , hydralazine    History of gout-on allopurinol .  Hold off for now.    CAD-continue aspirin .  No active issues.   Hyperlipidemia- continue Lipitor   History of seizure-continue Keppra   DVT  prophylaxis: heparin  injection 5,000 Units Start: 08/18/24 0600 SCDs Start: 08/18/24 0128  Disposition: Likely home in 1 to 2 days when okay with nephrology  Status is: Inpatient Remains inpatient appropriate because: Pending laboratory improvement.    Code Status:     Code Status: Full Code  Family Communication: None  Consultants: Nephrology  Procedures: None  Anti-infectives:  None  Anti-infectives (From admission, onward)    None        Subjective: Today, patient was seen and examined at bedside.  Patient denies any nausea vomiting fever chills shortness of breath dyspnea or chest pain.  Has been eating okay. Objective: Vitals:   08/20/24 0523 08/20/24 0853  BP: (!) 117/59 128/68  Pulse: 80 77  Resp: 14 14  Temp: 98.1 F (36.7 C) 98.4 F (36.9 C)  SpO2: 98% 100%    Intake/Output Summary (Last 24 hours) at 08/20/2024 1004 Last data filed at 08/19/2024 1730 Gross per 24 hour  Intake 223.16 ml  Output --  Net 223.16 ml   Filed Weights   08/18/24 0103  Weight: 63.4 kg   Body mass index is 21.89 kg/m.   Physical Exam:  General:  Average built, not in obvious distress HENT:   No scleral pallor or icterus noted. Oral mucosa is moist.  Chest: Diminished breath sounds bilaterally.  Clear to auscultation.  No crackles or wheezes. CVS: S1 &S2 heard. No murmur.  Regular rate and rhythm. Abdomen: Soft, nontender, nondistended.  Bowel sounds are heard.   Extremities: No cyanosis, clubbing or edema.  Peripheral pulses are palpable. Psych: Alert, awake and oriented, normal mood CNS: No focal neurological deficits. Skin: Warm and dry.  No rashes noted.  Data Review: I have personally reviewed the following laboratory data and studies,  CBC: Recent Labs  Lab 08/17/24 1848 08/18/24 1149 08/19/24 1058  WBC 9.3 7.3 6.9  HGB 6.6* 6.7* 7.2*  HCT 20.0* 20.1* 21.6*  MCV 84.0 84.5 84.4  PLT 409* 352 364   Basic Metabolic Panel: Recent Labs  Lab  08/17/24 1848 08/18/24 1149 08/19/24 1058  NA 140 138 137  K 3.7 3.4* 3.5  CL 101 106 104  CO2 23 18* 21*  GLUCOSE 110* 110* 152*  BUN 81* 76* 71*  CREATININE 6.44* 6.35* 5.92*  CALCIUM  8.7* 8.5* 8.7*   Liver Function Tests: Recent Labs  Lab 08/18/24 1149  AST 11*  ALT 9  ALKPHOS 50  BILITOT 0.3  PROT 5.6*  ALBUMIN 3.0*   No results for input(s): LIPASE, AMYLASE in the last 168 hours. No results for input(s): AMMONIA in the last 168 hours. Cardiac Enzymes: No results for input(s): CKTOTAL, CKMB, CKMBINDEX, TROPONINI in the last 168 hours. BNP (last 3 results) No results for input(s): BNP in the last 8760 hours.  ProBNP (last 3 results) No results for input(s): PROBNP in the last 8760 hours.  CBG: No results for input(s): GLUCAP in the last 168 hours. No results found for this or any previous visit (from the past 240 hours).   Studies: US  RENAL Result Date: 08/20/2024 CLINICAL DATA:  Acute kidney injury EXAM: RENAL / URINARY TRACT ULTRASOUND COMPLETE COMPARISON:  Renal ultrasound dated 01/07/2018 FINDINGS: Right Kidney: Length = 9.5 cm AP renal pelvis diameter = <10 mm Diffusely increased cortical echogenicity with preserved corticomedullary differentiation. Upper pole simple cyst measures 1.7 x 1.3 x 1.2 cm. Mild hydronephrosis. The ureter is not seen. Left Kidney: Length = 9.2 cm AP renal pelvis diameter = <10 mm Diffusely increased cortical echogenicity with preserved corticomedullary differentiation. Mild hydronephrosis. The ureter is not seen. Bladder: Bladder is not well seen, likely decompressed. Other: None. IMPRESSION: 1. Mild bilateral hydronephrosis. 2. Diffusely increased cortical echogenicity with preserved corticomedullary differentiation, suggestive of medical renal disease. Electronically Signed   By: Limin  Xu M.D.   On: 08/20/2024 09:48      Vernal Alstrom, MD  [facility] 08/20/2024  If 7PM-7AM, please contact  night-coverage        "

## 2024-08-20 NOTE — Consult Note (Signed)
 Reason for Consult: Renal failure Referring Physician:  Dr. 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 I do not see the utility of a renal biopsy; will follow up on renal ultrasound and will check PVR bladder scan as well. Urine sediment inactive,HCO3 21. Will hold  off on HCO3 for now. - Will check limited serologic w/u given inactive sediment and no h/o diabetes. 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 - He's going to need close f/u with [facility] for education, preparation for eventual dialysis, transplantation, etc.  - dose meds for GFR <15 ml/min -Monitor Daily I/Os, Daily weight  -Maintain MAP>65 for optimal renal perfusion.  - Avoid nephrotoxic agents such as IV contrast, NSAIDs, and phosphate containing bowel preps (FLEETS)  Anemia - s/o transfusion; TSAT 35% F152. Likely secondary to advanced CKD and will need to be followed closely and will start on ESA. 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 H/o seizures on Keppra  HLD on Lipitor   HPI: Jonathan Pierce is an 69 y.o. male with a PMH of hypertension, seizures, OSA, ETOH abuse, tobacco, CKD here after labs at [facility] showed Hb of 6.4.  Repeat labs confirmed that Hb was indeed in the mid 6's but creatinine noted to be 6.4; of note creatinine was already 3.7 05/05/24, 4.87 06/06/24 and prior labs before that was in 2018.  Patient already with significant proteinuria in 2018 with a UPC of 4.7g/g and was seen at that time by [facility] for nephrotic syndrome. He did not have a M spike at that time although the kappa chains were increased with a normal FLC ratio. Serologic w/u at that time including ANA, DSDHA, SSA, SSB, RF, MPO all neg. Plan was to biopsy the  kidneys at that time but appears that the patient declined and has not followed up with physicians for quite a few years.   He's now presenting with symptomatic anemia, denies dark tarry stool, BRBPR, hemoptysis. He denies NSAID use, quite smoking 03/2024 as well as obstructive like symptoms, herbal remedies, dizziness, chest pain, orthopnea or change in exercise tolerance.  He also denies rashes, joint pain.  ROS Pertinent items are noted in HPI.  Chemistry and CBC: Creatinine, Ser  Date/Time Value Ref Range Status  08/19/2024 10:58 AM 5.92 (H) 0.61 - 1.24 mg/dL Final  98/75/7973 88:50 AM 6.35 (H) 0.61 - 1.24 mg/dL Final  98/76/7973 93:51 PM 6.44 (H) 0.61 - 1.24 mg/dL Final  93/82/7980 94:56 AM 2.55 (H) 0.61 - 1.24 mg/dL Final  93/83/7980 94:55 AM 2.72 (H) 0.61 - 1.24 mg/dL Final  93/84/7980 94:46 AM 2.89 (H) 0.61 - 1.24 mg/dL Final  93/85/7980 90:63 PM 2.82 (H) 0.61 - 1.24 mg/dL Final  91/91/7983 94:63 PM 0.80 0.61 - 1.24 mg/dL Final  93/93/7983 97:69 PM 0.76 0.61 - 1.24 mg/dL Final   Recent Labs  Lab 08/17/24 1848 08/18/24 1149 08/19/24 1058  NA 140 138 137  K 3.7 3.4* 3.5  CL 101 106 104  CO2 23 18* 21*  GLUCOSE 110* 110* 152*  BUN 81* 76* 71*  CREATININE 6.44* 6.35* 5.92*  CALCIUM  8.7* 8.5* 8.7*   Recent Labs  Lab 08/17/24 1848 08/18/24 1149 08/19/24 1058  WBC 9.3 7.3 6.9  HGB 6.6* 6.7*  7.2*  HCT 20.0* 20.1* 21.6*  MCV 84.0 84.5 84.4  PLT 409* 352 364   Liver Function Tests: Recent Labs  Lab 08/18/24 1149  AST 11*  ALT 9  ALKPHOS 50  BILITOT 0.3  PROT 5.6*  ALBUMIN 3.0*   No results for input(s): LIPASE, AMYLASE in the last 168 hours. No results for input(s): AMMONIA in the last 168 hours. Cardiac Enzymes: No results for input(s): CKTOTAL, CKMB, CKMBINDEX, TROPONINI in the last 168 hours. Iron Studies:  Recent Labs    08/17/24 2040  IRON 95  TIBC 273  FERRITIN 152   PT/INR: @LABRCNTIP (inr:5)  Xrays/Other Studies: ) Results for  orders placed or performed during the hospital encounter of 08/17/24 (from the past 48 hours)  Comprehensive metabolic panel     Status: Abnormal   Collection Time: 08/18/24 11:49 AM  Result Value Ref Range   Sodium 138 135 - 145 mmol/L   Potassium 3.4 (L) 3.5 - 5.1 mmol/L   Chloride 106 98 - 111 mmol/L   CO2 18 (L) 22 - 32 mmol/L   Glucose, Bld 110 (H) 70 - 99 mg/dL    Comment: Glucose reference range applies only to samples taken after fasting for at least 8 hours.   BUN 76 (H) 8 - 23 mg/dL   Creatinine, Ser 3.64 (H) 0.61 - 1.24 mg/dL   Calcium  8.5 (L) 8.9 - 10.3 mg/dL   Total Protein 5.6 (L) 6.5 - 8.1 g/dL   Albumin 3.0 (L) 3.5 - 5.0 g/dL   AST 11 (L) 15 - 41 U/L   ALT 9 0 - 44 U/L   Alkaline Phosphatase 50 38 - 126 U/L   Total Bilirubin 0.3 0.0 - 1.2 mg/dL   GFR, Estimated 9 (L) >60 mL/min    Comment: (NOTE) Calculated using the CKD-EPI Creatinine Equation (2021)    Anion gap 14 5 - 15    Comment: Performed at [facility], [address], [place], [place] [address]  CBC     Status: Abnormal   Collection Time: 08/18/24 11:49 AM  Result Value Ref Range   WBC 7.3 4.0 - 10.5 K/uL   RBC 2.38 (L) 4.22 - 5.81 MIL/uL   Hemoglobin 6.7 (LL) 13.0 - 17.0 g/dL    Comment: REPEATED TO VERIFY CRITICAL VALUE NOTED.  VALUE IS CONSISTENT WITH PREVIOUSLY REPORTED AND CALLED VALUE.    HCT 20.1 (L) 39.0 - 52.0 %   MCV 84.5 80.0 - 100.0 fL   MCH 28.2 26.0 - 34.0 pg   MCHC 33.3 30.0 - 36.0 g/dL   RDW 85.1 88.4 - 84.4 %   Platelets 352 150 - 400 K/uL   nRBC 0.0 0.0 - 0.2 %    Comment: Performed at [facility], [address] [place], [place] [address]  HIV Antibody (routine testing w rflx)     Status: None   Collection Time: 08/19/24  4:53 AM  Result Value Ref Range   HIV Screen 4th Generation wRfx Non Reactive Non Reactive    Comment: Performed at [facility], [address] [place], [place] [address]  CBC     Status: Abnormal   Collection Time: 08/19/24 10:58 AM   Result Value Ref Range   WBC 6.9 4.0 - 10.5 K/uL   RBC 2.56 (L) 4.22 - 5.81 MIL/uL   Hemoglobin 7.2 (L) 13.0 - 17.0 g/dL   HCT 78.3 (L) 60.9 - 47.9 %   MCV 84.4 80.0 - 100.0 fL  MCH 28.1 26.0 - 34.0 pg   MCHC 33.3 30.0 - 36.0 g/dL   RDW 84.8 88.4 - 84.4 %   Platelets 364 150 - 400 K/uL   nRBC 0.0 0.0 - 0.2 %    Comment: Performed at [facility], [address], [place], [place] [address]  Basic metabolic panel     Status: Abnormal   Collection Time: 08/19/24 10:58 AM  Result Value Ref Range   Sodium 137 135 - 145 mmol/L   Potassium 3.5 3.5 - 5.1 mmol/L   Chloride 104 98 - 111 mmol/L   CO2 21 (L) 22 - 32 mmol/L   Glucose, Bld 152 (H) 70 - 99 mg/dL    Comment: Glucose reference range applies only to samples taken after fasting for at least 8 hours.   BUN 71 (H) 8 - 23 mg/dL   Creatinine, Ser 4.07 (H) 0.61 - 1.24 mg/dL   Calcium  8.7 (L) 8.9 - 10.3 mg/dL   GFR, Estimated 10 (L) >60 mL/min    Comment: (NOTE) Calculated using the CKD-EPI Creatinine Equation (2021)    Anion gap 12 5 - 15    Comment: Performed at [facility], [address], [place], [place] [address]   US  RENAL Result Date: 08/20/2024 CLINICAL DATA:  Acute kidney injury EXAM: RENAL / URINARY TRACT ULTRASOUND COMPLETE COMPARISON:  Renal ultrasound dated 01/07/2018 FINDINGS: Right Kidney: Length = 9.5 cm AP renal pelvis diameter = <10 mm Diffusely increased cortical echogenicity with preserved corticomedullary differentiation. Upper pole simple cyst measures 1.7 x 1.3 x 1.2 cm. Mild hydronephrosis. The ureter is not seen. Left Kidney: Length = 9.2 cm AP renal pelvis diameter = <10 mm Diffusely increased cortical echogenicity with preserved corticomedullary differentiation. Mild hydronephrosis. The ureter is not seen. Bladder: Bladder is not well seen, likely decompressed. Other: None. IMPRESSION: 1. Mild bilateral hydronephrosis. 2. Diffusely increased cortical echogenicity with preserved corticomedullary  differentiation, suggestive of medical renal disease. Electronically Signed   By: Limin  Xu M.D.   On: 08/20/2024 09:48    PMH:   Past Medical History:  Diagnosis Date   Depression    Seizures (HCC)     PSH:  History reviewed. No pertinent surgical history.  Allergies: Allergies[1]  Medications:   Prior to Admission medications  Medication Sig Start Date End Date Taking? Authorizing Provider  allopurinol  (ZYLOPRIM ) 100 MG tablet Take 100 mg by mouth daily. 02/16/17  Yes [provider]  amLODipine  (NORVASC ) 10 MG tablet Take 1 tablet (10 mg total) by mouth daily. 01/10/18  Yes Nikki Hansel Atlas, MD  aspirin  81 MG chewable tablet Chew 81 mg by mouth every morning. 06/06/24  Yes [provider]  atorvastatin  (LIPITOR) 40 MG tablet Take 40 mg by mouth at bedtime. 06/07/24  Yes [provider]  chlorthalidone  (HYGROTON ) 25 MG tablet Take 25 mg by mouth daily. for high blood pressure 06/06/24  Yes [provider]  hydrALAZINE  (APRESOLINE ) 100 MG tablet Take 100 mg by mouth 3 (three) times daily. 06/07/24  Yes [provider]  levETIRAcetam  (KEPPRA ) 500 MG tablet Take 500 mg by mouth 2 (two) times daily. 06/07/24  Yes [provider]  minoxidil  (LONITEN ) 2.5 MG tablet Take 2.5 mg by mouth at bedtime. 06/06/24  Yes [provider]    Discontinued Meds:   Medications Discontinued During This Encounter  Medication Reason   furosemide  (LASIX ) 80 MG tablet Change in therapy   hydrOXYzine  (ATARAX /VISTARIL ) 25 MG tablet Discontinued by provider  levETIRAcetam  (KEPPRA ) 750 MG tablet Dose change   traZODone  (DESYREL ) 50 MG tablet Patient Preference   chlorthalidone  (HYGROTON ) tablet 25 mg    lactated ringers  infusion    allopurinol  (ZYLOPRIM ) tablet 100 mg     Social History:  reports that he has been smoking cigarettes. He has never used smokeless tobacco. He reports current alcohol use. He reports that he does not use  drugs.  Family History:   Family History  Problem Relation Age of Onset   Diabetes Sister    CAD Neg Hx    Cancer Neg Hx    Stroke Neg Hx    Hypertension Neg Hx    Renal Disease Neg Hx     Blood pressure 128/68, pulse 77, temperature 98.4 F (36.9 C), temperature source Oral, resp. rate 14, height 5' 7 (1.702 m), weight 63.4 kg, SpO2 100%. General appearance: alert, cooperative, and appears stated age Head: NCAT Eyes: PERRL, EOM's intact.  Neck: no adenopathy, no carotid bruit, no JVD, supple, symmetrical, trachea midline, and thyroid not enlarged, symmetric, no tenderness/mass/nodules Back: symmetric, no curvature. ROM normal. No CVA tenderness. Resp: clear to auscultation bilaterally Cardio: regular rate and rhythm GI: soft, non-tender; bowel sounds normal; no masses,  no organomegaly Extremities: extremities normal, atraumatic, no cyanosis or edema Pulses: 2+ and symmetric       Jonathan Pierce, LYNWOOD ORN, MD 08/20/2024, 10:28 AM      [1] No Known Allergies

## 2024-08-21 LAB — KAPPA/LAMBDA LIGHT CHAINS
Kappa free light chain: 123.6 mg/L — ABNORMAL HIGH (ref 3.3–19.4)
Kappa, lambda light chain ratio: 1.96 — ABNORMAL HIGH (ref 0.26–1.65)
Lambda free light chains: 63 mg/L — ABNORMAL HIGH (ref 5.7–26.3)

## 2024-08-21 LAB — RENAL FUNCTION PANEL
Albumin: 3.2 g/dL — ABNORMAL LOW (ref 3.5–5.0)
Anion gap: 11 (ref 5–15)
BUN: 70 mg/dL — ABNORMAL HIGH (ref 8–23)
CO2: 22 mmol/L (ref 22–32)
Calcium: 9 mg/dL (ref 8.9–10.3)
Chloride: 105 mmol/L (ref 98–111)
Creatinine, Ser: 5.75 mg/dL — ABNORMAL HIGH (ref 0.61–1.24)
GFR, Estimated: 10 mL/min — ABNORMAL LOW
Glucose, Bld: 91 mg/dL (ref 70–99)
Phosphorus: 3.9 mg/dL (ref 2.5–4.6)
Potassium: 4.3 mmol/L (ref 3.5–5.1)
Sodium: 138 mmol/L (ref 135–145)

## 2024-08-21 LAB — HEPATITIS PANEL, ACUTE
HCV Ab: NONREACTIVE
Hep A IgM: NONREACTIVE
Hep B C IgM: NONREACTIVE
Hepatitis B Surface Ag: NONREACTIVE

## 2024-08-21 LAB — PARATHYROID HORMONE, INTACT (NO CA): PTH: 87 pg/mL — ABNORMAL HIGH (ref 15–65)

## 2024-08-21 MED ORDER — TAMSULOSIN HCL 0.4 MG PO CAPS
0.4000 mg | ORAL_CAPSULE | Freq: Every day | ORAL | Status: DC
  Administered 2024-08-21 – 2024-08-22 (×2): 0.4 mg via ORAL
  Filled 2024-08-21 (×2): qty 1

## 2024-08-21 MED ORDER — CHLORHEXIDINE GLUCONATE CLOTH 2 % EX PADS
6.0000 | MEDICATED_PAD | Freq: Every day | CUTANEOUS | Status: DC
  Administered 2024-08-21 – 2024-08-23 (×3): 6 via TOPICAL

## 2024-08-21 MED ORDER — VITAMIN D (ERGOCALCIFEROL) 1.25 MG (50000 UNIT) PO CAPS
50000.0000 [IU] | ORAL_CAPSULE | ORAL | Status: DC
  Administered 2024-08-21: 50000 [IU] via ORAL
  Filled 2024-08-21: qty 1

## 2024-08-21 NOTE — Progress Notes (Signed)
 " PROGRESS NOTE  Jonathan Pierce FMW:[id number] DOB: Apr 22, 1956 DOA: 08/17/2024 [facility]   LOS: 4 days   Brief narrative:   Jonathan Pierce is a 69 y.o. male with medical history significant of depression, seizure, CKD who presents emergency department due to abnormal labs.  Patient had gone to the [place] when he was noted to have hemoglobin 6.4 so was referred to the ED.  Patient denied any overt bleeding.  In the ED, hemoglobin was 6.6 with creatinine of 6.4 from baseline around 2.5.  Chest x-ray without any acute findings.  Patient was given 1 units of packed RBC and was admitted to the hospital for further evaluation and treatment.  Patient also received IV fluids.  During hospitalization patient has been seen by nephrology for further evaluation.  Assessment/Plan: Principal Problem:   Acute renal failure  Symptomatic anemia Likely likely anemia of chronic disease/CKD.  Patient presented with fatigue and tiredness.  Last hemoglobin noted was 11, 5 years back.  Hemoglobin on this presentation at 6.6.  Received 1 PRBC transfusion with improvement of hemoglobin to 7.2..  No evidence of bleeding. FOBT negative, reticulocyte's within normal range.  INR of 1.0.  Iron panel with normal parameters including ferritin of 152 with iron of 95.  Vitamin B12 was elevated folic acid  at 12.3.  Nephrology has been consulted at this time.  Likely anemia of chronic kidney disease.  Will receive Aranesp    AKI on CKD stage IIIb-  Creatinine on presentation at 6.4.  Baseline creatinine between 2.5-2.8 as per my record.  Nephrology on board and impression of CKD stage IV with significant proteinuria back in 2018 at [facility].  PTH, protein electrophoresis, kappa lambda light chains have been sent.  Thought processes was possible FSGS.SABRA  During hospitalization patient received IV fluids and 1 unit of packed RBC transfusion..    Urinalysis with proteinuria.  Will follow nephrology recommendation.  Renal  artery ultrasound showed mild bilateral hydronephrosis with diffusely increased cortical echogenicity suggestive medical renal disease.  Will get bladder scan.  Essential hypertension-continue amlodipine , hydralazine    History of gout-on allopurinol .  Hold off for now.    CAD-continue aspirin .  No active issues.   Hyperlipidemia- continue Lipitor   History of seizure-continue Keppra   Vitamin D  deficiency.  Vitamin D  level at 9.1.  Will give 50,000 units of vitamin D  today and repeat weekly.  DVT prophylaxis: heparin  injection 5,000 Units Start: 08/18/24 0600 SCDs Start: 08/18/24 0128  Disposition: Likely home in 1 to 2 days when okay with nephrology  Status is: Inpatient Remains inpatient appropriate because: Pending laboratory improvement and nephrology input.    Code Status:     Code Status: Full Code  Family Communication: None  Consultants: Nephrology  Procedures: PRBC transfusion 1 unit  Anti-infectives:  None  Anti-infectives (From admission, onward)    None        Subjective: Today, patient was seen and examined at bedside.  Patient denies any nausea vomiting fever chills shortness of breath dyspnea or chest pain.    Objective: Vitals:   08/21/24 0514 08/21/24 0757  BP: 131/70 128/64  Pulse: 85 81  Resp: 16 18  Temp: 98.2 F (36.8 C) 98.2 F (36.8 C)  SpO2: 100% 100%    Intake/Output Summary (Last 24 hours) at 08/21/2024 1151 Last data filed at 08/20/2024 1616 Gross per 24 hour  Intake 333.81 ml  Output --  Net 333.81 ml   Filed Weights   08/18/24 0103  Weight:  63.4 kg   Body mass index is 21.89 kg/m.   Physical Exam:  General:  Average built, not in obvious distress HENT: Mild pallor noted.. Oral mucosa is moist.  Chest: Diminished breath sounds bilaterally.  Clear to auscultation.  No crackles or wheezes. CVS: S1 &S2 heard. No murmur.  Regular rate and rhythm. Abdomen: Soft, nontender, nondistended.  Bowel sounds are heard.    Extremities: No cyanosis, clubbing or edema.  Peripheral pulses are palpable. Psych: Alert, awake and oriented, normal mood CNS: No focal neurological deficits. Skin: Warm and dry.  No rashes noted.  Data Review: I have personally reviewed the following laboratory data and studies,  CBC: Recent Labs  Lab 08/17/24 1848 08/18/24 1149 08/19/24 1058  WBC 9.3 7.3 6.9  HGB 6.6* 6.7* 7.2*  HCT 20.0* 20.1* 21.6*  MCV 84.0 84.5 84.4  PLT 409* 352 364   Basic Metabolic Panel: Recent Labs  Lab 08/17/24 1848 08/18/24 1149 08/19/24 1058 08/21/24 0557  NA 140 138 137 138  K 3.7 3.4* 3.5 4.3  CL 101 106 104 105  CO2 23 18* 21* 22  GLUCOSE 110* 110* 152* 91  BUN 81* 76* 71* 70*  CREATININE 6.44* 6.35* 5.92* 5.75*  CALCIUM  8.7* 8.5* 8.7* 9.0  PHOS  --   --   --  3.9   Liver Function Tests: Recent Labs  Lab 08/18/24 1149 08/21/24 0557  AST 11*  --   ALT 9  --   ALKPHOS 50  --   BILITOT 0.3  --   PROT 5.6*  --   ALBUMIN 3.0* 3.2*   No results for input(s): LIPASE, AMYLASE in the last 168 hours. No results for input(s): AMMONIA in the last 168 hours. Cardiac Enzymes: No results for input(s): CKTOTAL, CKMB, CKMBINDEX, TROPONINI in the last 168 hours. BNP (last 3 results) No results for input(s): BNP in the last 8760 hours.  ProBNP (last 3 results) No results for input(s): PROBNP in the last 8760 hours.  CBG: No results for input(s): GLUCAP in the last 168 hours. No results found for this or any previous visit (from the past 240 hours).   Studies: US  RENAL Result Date: 08/20/2024 CLINICAL DATA:  Acute kidney injury EXAM: RENAL / URINARY TRACT ULTRASOUND COMPLETE COMPARISON:  Renal ultrasound dated 01/07/2018 FINDINGS: Right Kidney: Length = 9.5 cm AP renal pelvis diameter = <10 mm Diffusely increased cortical echogenicity with preserved corticomedullary differentiation. Upper pole simple cyst measures 1.7 x 1.3 x 1.2 cm. Mild hydronephrosis. The ureter  is not seen. Left Kidney: Length = 9.2 cm AP renal pelvis diameter = <10 mm Diffusely increased cortical echogenicity with preserved corticomedullary differentiation. Mild hydronephrosis. The ureter is not seen. Bladder: Bladder is not well seen, likely decompressed. Other: None. IMPRESSION: 1. Mild bilateral hydronephrosis. 2. Diffusely increased cortical echogenicity with preserved corticomedullary differentiation, suggestive of medical renal disease. Electronically Signed   By: Jonathan  Pierce M.D.   On: 08/20/2024 09:48      Jonathan Alstrom, MD  [facility] 08/21/2024  If 7PM-7AM, please contact night-coverage        "

## 2024-08-21 NOTE — Progress Notes (Signed)
 [facility] Progress Note  Name: Jonathan Pierce MRN: [id number] DOB: 12-11-1955  Chief Complaint:  Outpatient labs with anemia   Subjective:  There is no urine output charted for 1/26 (or for several days prior).  He has been on LR at 50 ml/hr.  He feels ok.  Receives care through the [facility].  He states has never seen a kidney doctor. (Note seen at [facility] per charting below)    Review of systems:  Denies shortness of breath or chest pain  Denies n/v   -------------- Background on consult:  Jaqualyn Juday is an 69 y.o. male with a PMH of hypertension, seizures, OSA, ETOH abuse, tobacco, CKD here after labs at [facility] showed Hb of 6.4.  Repeat labs confirmed that Hb was indeed in the mid 6's but creatinine noted to be 6.4; of note creatinine was already 3.7 05/05/24, 4.87 06/06/24 and prior labs before that was in 2018.  Patient already with significant proteinuria in 2018 with a UPC of 4.7g/g and was seen at that time by [facility] for nephrotic syndrome. He did not have a M spike at that time although the kappa chains were increased with a normal FLC ratio. Serologic w/u at that time including ANA, DSDHA, SSA, SSB, RF, MPO all neg. Plan was to biopsy the kidneys at that time but appears that the patient declined and has not followed up with physicians for quite a few years.  He's now presenting with symptomatic anemia, denies dark tarry stool, BRBPR, hemoptysis. He denies NSAID use, quite smoking 03/2024 as well as obstructive like symptoms, herbal remedies, dizziness, chest pain, orthopnea or change in exercise tolerance.  He also denies rashes, joint pain.   Intake/Output Summary (Last 24 hours) at 08/21/2024 1154 Last data filed at 08/20/2024 1616 Gross per 24 hour  Intake 333.81 ml  Output --  Net 333.81 ml    Vitals:  Vitals:   08/20/24 1500 08/20/24 2147 08/21/24 0514 08/21/24 0757  BP: 125/63 123/65 131/70 128/64  Pulse: 89 87 85 81  Resp: 15 16 16 18   Temp: 98 F (36.7  C) 98.8 F (37.1 C) 98.2 F (36.8 C) 98.2 F (36.8 C)  TempSrc: Oral   Oral  SpO2: 99% 100% 100% 100%  Weight:      Height:         Physical Exam:  General adult male in bed in no acute distress HEENT normocephalic atraumatic extraocular movements intact sclera anicteric Neck supple trachea midline Lungs clear to auscultation bilaterally normal work of breathing at rest on room air  Heart S1S2 no rub Abdomen soft nontender nondistended Extremities no edema  Psych normal mood and affect Neuro - alert and conversant provides hx and follows commands    Medications reviewed   Labs:     Latest Ref Rng & Units 08/21/2024    5:57 AM 08/19/2024   10:58 AM 08/18/2024   11:49 AM  BMP  Glucose 70 - 99 mg/dL 91  847  889   BUN 8 - 23 mg/dL 70  71  76   Creatinine 0.61 - 1.24 mg/dL 4.24  4.07  3.64   Sodium 135 - 145 mmol/L 138  137  138   Potassium 3.5 - 5.1 mmol/L 4.3  3.5  3.4   Chloride 98 - 111 mmol/L 105  104  106   CO2 22 - 32 mmol/L 22  21  18    Calcium  8.9 - 10.3 mg/dL 9.0  8.7  8.5  Assessment/Plan:   AKI on CKD with creatinine already 3.7 05/05/24 with already nephrotic range proteinuria back in 2018, 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  Cr was 2.5-2.8 in 2019.  Renal biopsy felt to have limited utility and thus deferred.  Renal US  with mild bilateral hydro and diffusely increased echogenicity.  Note PVR low at 32 mL Renal US  with hydro with negligible PVR - start flomax  and insert foley catheter for now.  Reassess removal in 1-2 days   Ordered strict ins/outs Note limited serologic work-up was checked given inactive urine sediment and no h/o diabetes.  SPEP and serum free light chains are pending.  Neg HIV.  May be undiagnosed FSGS.  Check hepatitis panel  He's going to need close f/u with [facility] for education, preparation for eventual dialysis, possible transplantation and CKD care    Anemia of CKD - PRBC's per primary team TSAT 35% F152.  Likely secondary to  advanced CKD and will need to be followed closely outpatient  He was started on ESA - aranesp  100 mcg weekly on Mondays here  CBC in AM As above, SPEP and free light chains are pending Renal osteodystrophy - vitamin D  deficient and was started on weekly vit D for same.  Intact PTH is pending   Hypertension - note on minodixil - BP is well-controlled.  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 H/o seizures on Keppra   Disposition - Continue inpatient monitoring.  If Hb is stable and if renal function continues to improve or stabilizes, he may be able to be discharged in 1-2 days with close renal follow-up.  Nephrology will need set up follow-up   Katheryn JAYSON Saba, MD 08/21/2024 12:27 PM

## 2024-08-22 LAB — CBC
HCT: 21.2 % — ABNORMAL LOW (ref 39.0–52.0)
Hemoglobin: 7 g/dL — ABNORMAL LOW (ref 13.0–17.0)
MCH: 27.9 pg (ref 26.0–34.0)
MCHC: 33 g/dL (ref 30.0–36.0)
MCV: 84.5 fL (ref 80.0–100.0)
Platelets: 333 10*3/uL (ref 150–400)
RBC: 2.51 MIL/uL — ABNORMAL LOW (ref 4.22–5.81)
RDW: 15.5 % (ref 11.5–15.5)
WBC: 10.3 10*3/uL (ref 4.0–10.5)
nRBC: 0 % (ref 0.0–0.2)

## 2024-08-22 LAB — PREPARE RBC (CROSSMATCH)

## 2024-08-22 LAB — RENAL FUNCTION PANEL
Albumin: 3.2 g/dL — ABNORMAL LOW (ref 3.5–5.0)
Anion gap: 12 (ref 5–15)
BUN: 74 mg/dL — ABNORMAL HIGH (ref 8–23)
CO2: 21 mmol/L — ABNORMAL LOW (ref 22–32)
Calcium: 8.7 mg/dL — ABNORMAL LOW (ref 8.9–10.3)
Chloride: 105 mmol/L (ref 98–111)
Creatinine, Ser: 5.39 mg/dL — ABNORMAL HIGH (ref 0.61–1.24)
GFR, Estimated: 11 mL/min — ABNORMAL LOW
Glucose, Bld: 91 mg/dL (ref 70–99)
Phosphorus: 4 mg/dL (ref 2.5–4.6)
Potassium: 4 mmol/L (ref 3.5–5.1)
Sodium: 138 mmol/L (ref 135–145)

## 2024-08-22 LAB — PROTEIN ELECTROPHORESIS, SERUM
A/G Ratio: 1.2 (ref 0.7–1.7)
Albumin ELP: 2.7 g/dL — ABNORMAL LOW (ref 2.9–4.4)
Alpha-1-Globulin: 0.2 g/dL (ref 0.0–0.4)
Alpha-2-Globulin: 0.7 g/dL (ref 0.4–1.0)
Beta Globulin: 0.8 g/dL (ref 0.7–1.3)
Gamma Globulin: 0.6 g/dL (ref 0.4–1.8)
Globulin, Total: 2.3 g/dL (ref 2.2–3.9)
Total Protein ELP: 5 g/dL — ABNORMAL LOW (ref 6.0–8.5)

## 2024-08-22 MED ORDER — SODIUM CHLORIDE 0.9% IV SOLUTION: Freq: Once | INTRAVENOUS | Status: AC

## 2024-08-22 NOTE — Progress Notes (Signed)
  [facility]  Assessment/ Plan: Pt is a 69 y.o. yo male  with a PMH of hypertension, seizures, OSA, ETOH abuse, tobacco, CKD here after labs at [facility] showed Hb of 6.4.   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  Cr was 2.5-2.8 in 2019.  Renal biopsy felt to have limited utility and thus deferred.  Renal US  with mild bilateral hydro and diffusely increased echogenicity.  Note PVR low at 32 mL. Renal US  with hydro with negligible PVR - started flomax , can dc foley and may need OP urology follow up.    Note limited serologic work-up was checked given inactive urine sediment and no h/o diabetes.  Kappa/lmada ratio 1.96 due to low GFR, SPEP pending.  Neg HIV, Hep B, C.   Nonoliguric and serum creatinine level improving.  No signs or symptoms of uremia.  No acute indication to start dialysis. We will arrange follow-up at [facility] after discharge.  Patient said he was already referred to [facility] by [facility] nephrology.    Anemia of CKD - PRBC's per primary team TSAT 35% F152.  Likely secondary to advanced CKD and will need to be followed closely outpatient  He was started on ESA - aranesp  100 mcg weekly on Mondays here   Renal osteodystrophy - vitamin D  deficient and was started on weekly vit D for same.  Intact PTH is 87.   Hypertension - note on minodixil - BP is well-controlled.  If for HTN this would be the first medication to drop   H/o seizures on Keppra .  Nothing further to add.  Plan as outlined above.  Follow-up outpatient with nephrology. Sign off, please call us  back with question.  Discussed with the patient and his daughter at the bedside. Subjective: Seen and examined at bedside.  Denies nausea, vomiting, chest pain, shortness of breath.  Wanted to go home.  Urine output 2.1 L.  Family member at the bedside. Objective Vital signs in last 24  hours: Vitals:   08/21/24 1533 08/21/24 1947 08/22/24 0552 08/22/24 0723  BP: 126/69 114/61 139/70 132/64  Pulse: 87 93 90 90  Resp: 17 17 18 16   Temp: 98.7 F (37.1 C) 98.4 F (36.9 C) 98.1 F (36.7 C) 98.6 F (37 C)  TempSrc: Oral  Oral Oral  SpO2: 98% 98% 100% 98%  Weight:      Height:       Weight change:   Intake/Output Summary (Last 24 hours) at 08/22/2024 1238 Last data filed at 08/22/2024 9176 Gross per 24 hour  Intake 1286.76 ml  Output 2675 ml  Net -1388.24 ml       Labs: RENAL PANEL Recent Labs  Lab 08/17/24 1848 08/18/24 1149 08/19/24 1058 08/21/24 0557 08/22/24 0349  NA 140 138 137 138 138  K 3.7 3.4* 3.5 4.3 4.0  CL 101 106 104 105 105  CO2 23 18* 21* 22 21*  GLUCOSE 110* 110* 152* 91 91  BUN 81* 76* 71* 70* 74*  CREATININE 6.44* 6.35* 5.92* 5.75* 5.39*  CALCIUM  8.7* 8.5* 8.7* 9.0 8.7*  PHOS  --   --   --  3.9 4.0  ALBUMIN  --  3.0*  --  3.2* 3.2*    Liver Function Tests: Recent Labs  Lab 08/18/24 1149 08/21/24 0557 08/22/24 0349  AST 11*  --   --   ALT 9  --   --  ALKPHOS 50  --   --   BILITOT 0.3  --   --   PROT 5.6*  --   --   ALBUMIN 3.0* 3.2* 3.2*   No results for input(s): LIPASE, AMYLASE in the last 168 hours. No results for input(s): AMMONIA in the last 168 hours. CBC: Recent Labs    08/17/24 1848 08/17/24 2040 08/18/24 1149 08/19/24 1058 08/22/24 0349  HGB 6.6*  --  6.7* 7.2* 7.0*  MCV 84.0  --  84.5 84.4 84.5  VITAMINB12  --  1,055*  --   --   --   FOLATE  --  12.3  --   --   --   FERRITIN  --  152  --   --   --   TIBC  --  273  --   --   --   IRON  --  95  --   --   --   RETICCTPCT  --  2.2  --   --   --     Cardiac Enzymes: No results for input(s): CKTOTAL, CKMB, CKMBINDEX, TROPONINI in the last 168 hours. CBG: No results for input(s): GLUCAP in the last 168 hours.  Iron Studies: No results for input(s): IRON, TIBC, TRANSFERRIN, FERRITIN in the last 72 hours. Studies/Results: No  results found.  Medications: Infusions:  lactated ringers  50 mL/hr at 08/21/24 2302    Scheduled Medications:  sodium chloride    Intravenous Once   amLODipine   10 mg Oral Daily   aspirin   81 mg Oral q morning   atorvastatin   40 mg Oral QHS   Chlorhexidine  Gluconate Cloth  6 each Topical Daily   darbepoetin (ARANESP ) injection - DIALYSIS  100 mcg Subcutaneous Q Mon-1800   heparin   5,000 Units Subcutaneous Q8H   hydrALAZINE   100 mg Oral TID   levETIRAcetam   500 mg Oral BID   minoxidil   2.5 mg Oral QHS   tamsulosin   0.4 mg Oral QPC supper   Vitamin D  (Ergocalciferol )  50,000 Units Oral Q7 days    have reviewed scheduled and prn medications.  Physical Exam: General:NAD, comfortable Heart:RRR, s1s2 nl Lungs:clear b/l, no crackle Abdomen:soft, Non-tender, non-distended Extremities:No edema Neurology: Alert, awake and following commands  Jonathan Pierce Jonathan Pierce 08/22/2024,12:38 PM  LOS: 5 days

## 2024-08-22 NOTE — Plan of Care (Signed)

## 2024-08-22 NOTE — Progress Notes (Signed)
 " PROGRESS NOTE  Jonathan Pierce FMW:[id number] DOB: May 23, 1956 DOA: 08/17/2024 [facility]   LOS: 5 days   Brief narrative: Jonathan Pierce is a 69 y.o. male with medical history significant of depression, seizure, CKD who presents to the ED due to abnormal labs.  Patient had gone to the [facility] when he was noted to have hemoglobin 6.4 so was referred to the ED.  Patient denied any overt bleeding.  In the ED, hemoglobin was 6.6 with creatinine of 6.4 from baseline around 2.5.  Chest x-ray without any acute findings.  Patient was given 1 units of packed RBC and was admitted to the hospital for further evaluation and treatment.  Patient also received IV fluids.  During hospitalization patient has been seen by nephrology for further evaluation.  Assessment/Plan: Principal Problem:   Acute renal failure  Symptomatic anemia Likely likely anemia of chronic disease/CKD.  Patient presented with fatigue and tiredness.  Last hemoglobin noted was 11, 5 years back.  Hemoglobin on this presentation at 6.6.  Received 1 PRBC transfusion with improvement of hemoglobin to 7.2..  No evidence of bleeding. FOBT negative, reticulocyte's within normal range.  INR of 1.0.  Iron panel with normal parameters including ferritin of 152 with iron of 95.  Vitamin B12 was elevated folic acid  at 12.3.  Nephrology consulted and signed off, s/p Aranesp  Plan to transfuse 1 more unit of PRBC today   AKI on CKD stage IIIb Creatinine on presentation at 6.4.  Baseline creatinine between 2.5-2.8 as per my record.  Nephrology on board and impression of CKD stage IV with significant proteinuria back in 2018 at [facility].  PTH, protein electrophoresis, kappa lambda light chains have been sent.  Thought processes was possible FSGS.SABRA  During hospitalization patient received IV fluids and 1 unit of packed RBC transfusion..    Urinalysis with proteinuria.  Renal artery ultrasound showed mild bilateral hydronephrosis with  diffusely increased cortical echogenicity suggestive medical renal disease.  Continue gentle IV fluid  Essential hypertension-continue amlodipine , hydralazine , minoxidil    History of gout-on allopurinol .  Hold off for now.   CAD-continue aspirin , denies chest pain   Hyperlipidemia- continue Lipitor   History of seizure-continue Keppra   Vitamin D  deficiency.  Vitamin D  level at 9.1.  Will give 50,000 units of vitamin D  today and repeat weekly.  DVT prophylaxis: heparin  injection 5,000 Units Start: 08/18/24 0600 SCDs Start: 08/18/24 0128  Disposition: Likely home on 1/29  Status is: Inpatient Remains inpatient appropriate because: Pending laboratory improvement and nephrology input.    Code Status:     Code Status: Full Code  Family Communication: None  Consultants: Nephrology  Procedures: PRBC transfusion 1 unit  Anti-infectives:  None  Anti-infectives (From admission, onward)    None        Subjective: Today, pt denies any new complaints. Transfuse 1U of PRBC  Objective: Vitals:   08/22/24 0723 08/22/24 1513  BP: 132/64 127/64  Pulse: 90 93  Resp: 16 17  Temp: 98.6 F (37 C) 98.5 F (36.9 C)  SpO2: 98% 97%    Intake/Output Summary (Last 24 hours) at 08/22/2024 1657 Last data filed at 08/22/2024 9176 Gross per 24 hour  Intake --  Output 2675 ml  Net -2675 ml   Filed Weights   08/18/24 0103  Weight: 63.4 kg   Body mass index is 21.89 kg/m.   Physical Exam: General: NAD  Cardiovascular: S1, S2 present Respiratory: CTAB Abdomen: Soft, nontender, nondistended, bowel sounds present Musculoskeletal: No  bilateral pedal edema noted Skin: Normal Psychiatry: Normal mood   Data Review: I have personally reviewed the following laboratory data and studies,  CBC: Recent Labs  Lab 08/17/24 1848 08/18/24 1149 08/19/24 1058 08/22/24 0349  WBC 9.3 7.3 6.9 10.3  HGB 6.6* 6.7* 7.2* 7.0*  HCT 20.0* 20.1* 21.6* 21.2*  MCV 84.0 84.5 84.4 84.5   PLT 409* 352 364 333   Basic Metabolic Panel: Recent Labs  Lab 08/17/24 1848 08/18/24 1149 08/19/24 1058 08/21/24 0557 08/22/24 0349  NA 140 138 137 138 138  K 3.7 3.4* 3.5 4.3 4.0  CL 101 106 104 105 105  CO2 23 18* 21* 22 21*  GLUCOSE 110* 110* 152* 91 91  BUN 81* 76* 71* 70* 74*  CREATININE 6.44* 6.35* 5.92* 5.75* 5.39*  CALCIUM  8.7* 8.5* 8.7* 9.0 8.7*  PHOS  --   --   --  3.9 4.0   Liver Function Tests: Recent Labs  Lab 08/18/24 1149 08/21/24 0557 08/22/24 0349  AST 11*  --   --   ALT 9  --   --   ALKPHOS 50  --   --   BILITOT 0.3  --   --   PROT 5.6*  --   --   ALBUMIN 3.0* 3.2* 3.2*   No results for input(s): LIPASE, AMYLASE in the last 168 hours. No results for input(s): AMMONIA in the last 168 hours. Cardiac Enzymes: No results for input(s): CKTOTAL, CKMB, CKMBINDEX, TROPONINI in the last 168 hours. BNP (last 3 results) No results for input(s): BNP in the last 8760 hours.  ProBNP (last 3 results) No results for input(s): PROBNP in the last 8760 hours.  CBG: No results for input(s): GLUCAP in the last 168 hours. No results found for this or any previous visit (from the past 240 hours).   Studies: No results found.     Lebron JINNY Cage, MD  [facility] 08/22/2024  If 7PM-7AM, please contact night-coverage        "

## 2024-08-23 ENCOUNTER — Other Ambulatory Visit (HOSPITAL_COMMUNITY): Payer: Self-pay

## 2024-08-23 LAB — TYPE AND SCREEN
ABO/RH(D): O POS
Antibody Screen: NEGATIVE
Unit division: 0

## 2024-08-23 LAB — CBC
HCT: 24.9 % — ABNORMAL LOW (ref 39.0–52.0)
Hemoglobin: 8.3 g/dL — ABNORMAL LOW (ref 13.0–17.0)
MCH: 28.9 pg (ref 26.0–34.0)
MCHC: 33.3 g/dL (ref 30.0–36.0)
MCV: 86.8 fL (ref 80.0–100.0)
Platelets: 328 10*3/uL (ref 150–400)
RBC: 2.87 MIL/uL — ABNORMAL LOW (ref 4.22–5.81)
RDW: 15.5 % (ref 11.5–15.5)
WBC: 8.5 10*3/uL (ref 4.0–10.5)
nRBC: 0 % (ref 0.0–0.2)

## 2024-08-23 LAB — BPAM RBC
Blood Product Expiration Date: 202602202359
ISSUE DATE / TIME: 202601282331
Unit Type and Rh: 5100

## 2024-08-23 LAB — BASIC METABOLIC PANEL WITH GFR
Anion gap: 11 (ref 5–15)
BUN: 69 mg/dL — ABNORMAL HIGH (ref 8–23)
CO2: 22 mmol/L (ref 22–32)
Calcium: 9 mg/dL (ref 8.9–10.3)
Chloride: 104 mmol/L (ref 98–111)
Creatinine, Ser: 5.15 mg/dL — ABNORMAL HIGH (ref 0.61–1.24)
GFR, Estimated: 12 mL/min — ABNORMAL LOW
Glucose, Bld: 90 mg/dL (ref 70–99)
Potassium: 4.1 mmol/L (ref 3.5–5.1)
Sodium: 136 mmol/L (ref 135–145)

## 2024-08-23 MED ORDER — VITAMIN D (ERGOCALCIFEROL) 1.25 MG (50000 UNIT) PO CAPS
50000.0000 [IU] | ORAL_CAPSULE | ORAL | 0 refills | Status: AC
  Filled 2024-08-23: qty 4, 28d supply, fill #0

## 2024-08-23 MED ORDER — TAMSULOSIN HCL 0.4 MG PO CAPS
0.4000 mg | ORAL_CAPSULE | Freq: Every day | ORAL | 0 refills | Status: AC
  Filled 2024-08-23: qty 30, 30d supply, fill #0

## 2024-08-23 NOTE — Progress Notes (Signed)
 Patient discharged, Important Message Letter mailed to the patient.

## 2024-08-23 NOTE — Discharge Summary (Signed)
 " Physician Discharge Summary   Patient: Jonathan Pierce MRN: [id number] DOB: 02-10-1956  Admit date:     08/17/2024  Discharge date: 08/23/24  Discharge Physician: Lebron JINNY Cage   PCP: [facility]   Recommendations at discharge:   Follow-up with PCP in 1 week Follow-up with nephrology outpatient as scheduled  Discharge Diagnoses: Principal Problem:   Acute renal failure    Hospital Course: Jonathan Pierce is a 69 y.o. male with medical history significant of depression, seizure, CKD who presents to the ED due to abnormal labs.  Patient had gone to the [facility] when he was noted to have hemoglobin 6.4 so was referred to the ED.  Patient denied any overt bleeding.  In the ED, hemoglobin was 6.6 with creatinine of 6.4 from baseline around 2.5.  Chest x-ray without any acute findings.  Patient was given 1 units of packed RBC and was admitted to the hospital for further evaluation and treatment.  Patient also received IV fluids.  During hospitalization patient has been seen by nephrology for further evaluation, and has been advised to avoid nephrotoxics including medications such as NSAIDs and discontinue home chlorthalidone .  Patient advised to follow-up with PCP with repeat labs as well as outpatient nephrology.   Today, patient denies any nausea/vomiting, fever/chills, chest pain, shortness of breath, abdominal pain.   Assessment and Plan:  Symptomatic anemia Likely likely anemia of chronic disease/CKD.  Patient presented with fatigue and tiredness.  Last hemoglobin noted was 11, 5 years back.  Hemoglobin on this presentation at 6.6.  Received a total of 2 units of PRBC transfusion with improvement of hemoglobin. No evidence of bleeding. FOBT negative, reticulocyte's within normal range.  INR of 1.0.  Iron panel with normal parameters including ferritin of 152 with iron of 95.  Vitamin B12 was elevated folic acid  at 12.3.  Nephrology consulted and signed off, s/p Aranesp    AKI on CKD  stage IIIb Creatinine on presentation at 6.4.  Baseline creatinine between 2.5-2.8 as per my record.  Nephrology on board and impression of CKD stage IV with significant proteinuria back in 2018 at [facility].  PTH, protein electrophoresis, kappa lambda light chains have been sent.  Thought processes was possible FSGS. Urinalysis with proteinuria.  Renal artery ultrasound showed mild bilateral hydronephrosis with diffusely increased cortical echogenicity suggestive medical renal disease   Essential hypertension-continue amlodipine , hydralazine , minoxidil , discontinue chlorthalidone    History of gout-on allopurinol .  Hold off for now.   CAD-continue aspirin , denies chest pain   Hyperlipidemia- continue Lipitor   History of seizure-continue Keppra    Vitamin D  deficiency.  Vitamin D  level at 9.1.  Will give 50,000 units of vitamin D  today and repeat weekly.       Consultants: Nephrology Procedures performed: None Disposition: Home Diet recommendation:  Renal diet    DISCHARGE MEDICATION: Allergies as of 08/23/2024   No Known Allergies      Medication List     PAUSE taking these medications    allopurinol  100 MG tablet Wait to take this until your doctor or other care provider tells you to start again. Commonly known as: ZYLOPRIM  Take 100 mg by mouth daily.       STOP taking these medications    chlorthalidone  25 MG tablet Commonly known as: HYGROTON        TAKE these medications    amLODipine  10 MG tablet Commonly known as: NORVASC  Take 1 tablet (10 mg total) by mouth daily.   aspirin  81 MG chewable  tablet Chew 81 mg by mouth every morning.   atorvastatin  40 MG tablet Commonly known as: LIPITOR Take 40 mg by mouth at bedtime.   hydrALAZINE  100 MG tablet Commonly known as: APRESOLINE  Take 100 mg by mouth 3 (three) times daily.   levETIRAcetam  500 MG tablet Commonly known as: KEPPRA  Take 500 mg by mouth 2 (two) times daily.   minoxidil  2.5  MG tablet Commonly known as: LONITEN  Take 2.5 mg by mouth at bedtime.   tamsulosin  0.4 MG Caps capsule Commonly known as: FLOMAX  Take 1 capsule (0.4 mg total) by mouth daily after supper.   Vitamin D  (Ergocalciferol ) 1.25 MG (50000 UNIT) Caps capsule Commonly known as: DRISDOL  Take 1 capsule (50,000 Units total) by mouth every 7 (seven) days. Start taking on: August 28, 2024        Follow-up Information     [facility] Follow up in 1 week(s).   Contact information: [address] [facility] [place] [address] [phone]                Discharge Exam: Jonathan Pierce   08/18/24 0103  Weight: 63.4 kg   General: NAD  Cardiovascular: S1, S2 present Respiratory: CTAB Abdomen: Soft, nontender, nondistended, bowel sounds present Musculoskeletal: No bilateral pedal edema noted Skin: Normal Psychiatry: Normal mood   Condition at discharge: stable  The results of significant diagnostics from this hospitalization (including imaging, microbiology, ancillary and laboratory) are listed below for reference.   Imaging Studies: US  RENAL Result Date: 08/20/2024 CLINICAL DATA:  Acute kidney injury EXAM: RENAL / URINARY TRACT ULTRASOUND COMPLETE COMPARISON:  Renal ultrasound dated 01/07/2018 FINDINGS: Right Kidney: Length = 9.5 cm AP renal pelvis diameter = <10 mm Diffusely increased cortical echogenicity with preserved corticomedullary differentiation. Upper pole simple cyst measures 1.7 x 1.3 x 1.2 cm. Mild hydronephrosis. The ureter is not seen. Left Kidney: Length = 9.2 cm AP renal pelvis diameter = <10 mm Diffusely increased cortical echogenicity with preserved corticomedullary differentiation. Mild hydronephrosis. The ureter is not seen. Bladder: Bladder is not well seen, likely decompressed. Other: None. IMPRESSION: 1. Mild bilateral hydronephrosis. 2. Diffusely increased cortical echogenicity with preserved corticomedullary differentiation, suggestive  of medical renal disease. Electronically Signed   By: Limin  Xu M.D.   On: 08/20/2024 09:48   DG Chest Portable 1 View Result Date: 08/18/2024 EXAM: 1 VIEW(S) XRAY OF THE CHEST 08/18/2024 12:21:40 AM COMPARISON: None available. CLINICAL HISTORY: Anemia. FINDINGS: LUNGS AND PLEURA: No focal pulmonary opacity. No pleural effusion. No pneumothorax. HEART AND MEDIASTINUM: Cardiomegaly. Aortic arch atherosclerosis. BONES AND SOFT TISSUES: No acute osseous abnormality. IMPRESSION: 1. Cardiomegaly. 2. No acute findings. Electronically signed by: Morgane Naveau MD 08/18/2024 12:30 AM EST RP Workstation: [id number]    Microbiology: No results found for this or any previous visit.  Labs: CBC: Recent Labs  Lab 08/17/24 1848 08/18/24 1149 08/19/24 1058 08/22/24 0349 08/23/24 0551  WBC 9.3 7.3 6.9 10.3 8.5  HGB 6.6* 6.7* 7.2* 7.0* 8.3*  HCT 20.0* 20.1* 21.6* 21.2* 24.9*  MCV 84.0 84.5 84.4 84.5 86.8  PLT 409* 352 364 333 328   Basic Metabolic Panel: Recent Labs  Lab 08/18/24 1149 08/19/24 1058 08/21/24 0557 08/22/24 0349 08/23/24 0551  NA 138 137 138 138 136  K 3.4* 3.5 4.3 4.0 4.1  CL 106 104 105 105 104  CO2 18* 21* 22 21* 22  GLUCOSE 110* 152* 91 91 90  BUN 76* 71* 70* 74* 69*  CREATININE 6.35* 5.92* 5.75* 5.39* 5.15*  CALCIUM   8.5* 8.7* 9.0 8.7* 9.0  PHOS  --   --  3.9 4.0  --    Liver Function Tests: Recent Labs  Lab 08/18/24 1149 08/21/24 0557 08/22/24 0349  AST 11*  --   --   ALT 9  --   --   ALKPHOS 50  --   --   BILITOT 0.3  --   --   PROT 5.6*  --   --   ALBUMIN 3.0* 3.2* 3.2*   CBG: No results for input(s): GLUCAP in the last 168 hours.  Discharge time spent: greater than 30 minutes.  Signed: Lebron JINNY Cage, MD [facility] 08/23/2024 "

## 2024-08-23 NOTE — Plan of Care (Signed)
   Problem: Education: Goal: Knowledge of General Education information will improve Description Including pain rating scale, medication(s)/side effects and non-pharmacologic comfort measures Outcome: Progressing   Problem: Health Behavior/Discharge Planning: Goal: Ability to manage health-related needs will improve Outcome: Progressing

## 2024-08-23 NOTE — Plan of Care (Signed)

## 2024-08-23 NOTE — Plan of Care (Signed)
" °  Problem: Education: Goal: Knowledge of General Education information will improve Description: Including pain rating scale, medication(s)/side effects and non-pharmacologic comfort measures 08/23/2024 1045 by Delores Kirsch, RN Outcome: Adequate for Discharge 08/23/2024 1044 by Delores Kirsch, RN Outcome: Progressing   Problem: Health Behavior/Discharge Planning: Goal: Ability to manage health-related needs will improve 08/23/2024 1045 by Delores Kirsch, RN Outcome: Adequate for Discharge 08/23/2024 1044 by Delores Kirsch, RN Outcome: Progressing   Problem: Clinical Measurements: Goal: Ability to maintain clinical measurements within normal limits will improve Outcome: Adequate for Discharge Goal: Will remain free from infection Outcome: Adequate for Discharge Goal: Diagnostic test results will improve Outcome: Adequate for Discharge Goal: Respiratory complications will improve Outcome: Adequate for Discharge Goal: Cardiovascular complication will be avoided Outcome: Adequate for Discharge   Problem: Activity: Goal: Risk for activity intolerance will decrease Outcome: Adequate for Discharge   Problem: Nutrition: Goal: Adequate nutrition will be maintained Outcome: Adequate for Discharge   Problem: Coping: Goal: Level of anxiety will decrease Outcome: Adequate for Discharge   Problem: Elimination: Goal: Will not experience complications related to bowel motility Outcome: Adequate for Discharge Goal: Will not experience complications related to urinary retention Outcome: Adequate for Discharge   Problem: Pain Managment: Goal: General experience of comfort will improve and/or be controlled Outcome: Adequate for Discharge   Problem: Safety: Goal: Ability to remain free from injury will improve Outcome: Adequate for Discharge   Problem: Skin Integrity: Goal: Risk for impaired skin integrity will decrease Outcome: Adequate for Discharge   "

## 2024-08-23 NOTE — Care Management Important Message (Signed)
 Important Message  Patient Details  Name: Jonathan Pierce MRN: [id number] Date of Birth: 11-25-1955   Important Message Given:  No     [name] 08/23/2024, 1:27 PM
